# Patient Record
Sex: Female | Born: 1975 | Race: Black or African American | Hispanic: No | Marital: Single | Smoking: Never smoker
Health system: Southern US, Community
[De-identification: ages and names within clinical notes are randomized; demographics above are authoritative.]

## PROBLEM LIST (undated history)

## (undated) ENCOUNTER — Emergency Department: Payer: Self-pay

## (undated) DIAGNOSIS — F32A Depression, unspecified: Secondary | ICD-10-CM

## (undated) DIAGNOSIS — K635 Polyp of colon: Secondary | ICD-10-CM

## (undated) DIAGNOSIS — E669 Obesity, unspecified: Secondary | ICD-10-CM

## (undated) DIAGNOSIS — R7303 Prediabetes: Secondary | ICD-10-CM

## (undated) DIAGNOSIS — K649 Unspecified hemorrhoids: Secondary | ICD-10-CM

## (undated) DIAGNOSIS — N920 Excessive and frequent menstruation with regular cycle: Secondary | ICD-10-CM

## (undated) DIAGNOSIS — D250 Submucous leiomyoma of uterus: Secondary | ICD-10-CM

## (undated) DIAGNOSIS — D649 Anemia, unspecified: Secondary | ICD-10-CM

## (undated) DIAGNOSIS — G4730 Sleep apnea, unspecified: Secondary | ICD-10-CM

## (undated) DIAGNOSIS — E049 Nontoxic goiter, unspecified: Secondary | ICD-10-CM

## (undated) DIAGNOSIS — I499 Cardiac arrhythmia, unspecified: Secondary | ICD-10-CM

## (undated) DIAGNOSIS — G5600 Carpal tunnel syndrome, unspecified upper limb: Secondary | ICD-10-CM

## (undated) DIAGNOSIS — F329 Major depressive disorder, single episode, unspecified: Secondary | ICD-10-CM

## (undated) DIAGNOSIS — N83209 Unspecified ovarian cyst, unspecified side: Secondary | ICD-10-CM

## (undated) HISTORY — PX: COLONOSCOPY: SHX174

## (undated) HISTORY — DX: Major depressive disorder, single episode, unspecified: F32.9

## (undated) HISTORY — DX: Carpal tunnel syndrome, unspecified upper limb: G56.00

## (undated) HISTORY — DX: Polyp of colon: K63.5

## (undated) HISTORY — DX: Depression, unspecified: F32.A

## (undated) HISTORY — DX: Anemia, unspecified: D64.9

---

## 1999-04-17 ENCOUNTER — Other Ambulatory Visit: Admission: RE | Admit: 1999-04-17 | Discharge: 1999-04-17 | Payer: Self-pay | Admitting: *Deleted

## 2000-01-08 ENCOUNTER — Other Ambulatory Visit: Admission: RE | Admit: 2000-01-08 | Discharge: 2000-01-08 | Payer: Self-pay | Admitting: *Deleted

## 2004-01-22 ENCOUNTER — Ambulatory Visit: Payer: Self-pay

## 2004-02-17 ENCOUNTER — Ambulatory Visit: Payer: Self-pay

## 2004-05-04 ENCOUNTER — Ambulatory Visit: Payer: Self-pay

## 2004-09-16 ENCOUNTER — Inpatient Hospital Stay: Payer: Self-pay | Admitting: Unknown Physician Specialty

## 2013-03-08 HISTORY — PX: OTHER SURGICAL HISTORY: SHX169

## 2014-04-08 HISTORY — PX: OTHER SURGICAL HISTORY: SHX169

## 2014-04-24 LAB — HM PAP SMEAR: HM PAP: NORMAL

## 2014-04-24 LAB — HPV, HIGH-RISK: HPV DNA High Risk: NEGATIVE

## 2014-12-04 ENCOUNTER — Encounter (HOSPITAL_COMMUNITY): Payer: Self-pay

## 2014-12-04 ENCOUNTER — Emergency Department (HOSPITAL_COMMUNITY)
Admission: EM | Admit: 2014-12-04 | Discharge: 2014-12-04 | Disposition: A | Discharge status: Home / Self Care | Payer: BLUE CROSS/BLUE SHIELD | Attending: Emergency Medicine | Admitting: Emergency Medicine

## 2014-12-04 DIAGNOSIS — Z3202 Encounter for pregnancy test, result negative: Secondary | ICD-10-CM | POA: Diagnosis not present

## 2014-12-04 DIAGNOSIS — R55 Syncope and collapse: Secondary | ICD-10-CM | POA: Diagnosis not present

## 2014-12-04 LAB — BASIC METABOLIC PANEL
ANION GAP: 7 (ref 5–15)
BUN: 8 mg/dL (ref 6–20)
CHLORIDE: 108 mmol/L (ref 101–111)
CO2: 26 mmol/L (ref 22–32)
Calcium: 9 mg/dL (ref 8.9–10.3)
Creatinine, Ser: 0.69 mg/dL (ref 0.44–1.00)
Glucose, Bld: 100 mg/dL — ABNORMAL HIGH (ref 65–99)
POTASSIUM: 3.5 mmol/L (ref 3.5–5.1)
SODIUM: 141 mmol/L (ref 135–145)

## 2014-12-04 LAB — CBG MONITORING, ED: GLUCOSE-CAPILLARY: 100 mg/dL — AB (ref 65–99)

## 2014-12-04 LAB — URINALYSIS, ROUTINE W REFLEX MICROSCOPIC
BILIRUBIN URINE: NEGATIVE
Glucose, UA: NEGATIVE mg/dL
Hgb urine dipstick: NEGATIVE
KETONES UR: NEGATIVE mg/dL
LEUKOCYTES UA: NEGATIVE
NITRITE: NEGATIVE
Protein, ur: NEGATIVE mg/dL
SPECIFIC GRAVITY, URINE: 1.03 (ref 1.005–1.030)
UROBILINOGEN UA: 0.2 mg/dL (ref 0.0–1.0)
pH: 6 (ref 5.0–8.0)

## 2014-12-04 LAB — CBC
HEMATOCRIT: 35.6 % — AB (ref 36.0–46.0)
Hemoglobin: 11.5 g/dL — ABNORMAL LOW (ref 12.0–15.0)
MCH: 26.7 pg (ref 26.0–34.0)
MCHC: 32.3 g/dL (ref 30.0–36.0)
MCV: 82.8 fL (ref 78.0–100.0)
Platelets: 321 10*3/uL (ref 150–400)
RBC: 4.3 MIL/uL (ref 3.87–5.11)
RDW: 15.1 % (ref 11.5–15.5)
WBC: 9 10*3/uL (ref 4.0–10.5)

## 2014-12-04 LAB — POC URINE PREG, ED: PREG TEST UR: NEGATIVE

## 2014-12-04 NOTE — ED Provider Notes (Signed)
CSN: [id number]     Arrival date & time 12/04/14  1112 History   First MD Initiated Contact with Patient 12/04/14 1112     No chief complaint on file.    (Consider location/radiation/quality/duration/timing/severity/associated sxs/prior Treatment) HPI   39 year old female presenting for evaluation of syncope. Patient was brought here via EMS. Patient report she was sitting at a meeting at her workplace earlier today when she apparently have "zone out". She recall her coworker asking if she has chest pain at that time she felt she may have of a faint chest pain lasting for a few seconds but has since resolved. Coworker was concerned and EMS was called to bring patient to the ED. She had a near syncopal episode without loss of consciousness. She denies falling or injuring herself in any way. At this time she is back to her baseline and has no specific complaint. She admits that she has been taking phentermine for weight loss since August and has lost approximately 11 pounds in one month. She also endorsed having increasing stress both with work and with home for the past month in which she has not been eating or sleeping by she normally does. Her daughter was recently diagnosed with diabetes type 1 which concerns her. She did not eat much yesterday. Her last menstrual period was 9/22 /2016. She also endorsed some cold symptoms for the past 3 days including occasional cough and sore throat which seems to be improving. Otherwise patient denies having fever, chills, headache, neck stiffness, active chest pain, shortness of breath, abdominal pain, nausea vomiting diarrhea, dysuria, focal numbness or weakness, or rash. She denies self medicating with alcohol or street drugs. She denies any homicidal or suicidal ideation. She denies any prior history of cardiac disease, arrhythmia, or exertional syncope.  No past medical history on file. No past surgical history on file. No family history on file. Social  History  Substance Use Topics  . Smoking status: Not on file  . Smokeless tobacco: Not on file  . Alcohol Use: Not on file   OB History    No data available     Review of Systems  All other systems reviewed and are negative.     Allergies  Review of patient's allergies indicates not on file.  Home Medications   Prior to Admission medications   Not on File   There were no vitals taken for this visit. Physical Exam  Constitutional: She is oriented to person, place, and time. She appears well-developed and well-nourished. No distress.  HENT:  Head: Atraumatic.  Mouth/Throat: Oropharynx is clear and moist.  Eyes: Conjunctivae and EOM are normal. Pupils are equal, round, and reactive to light.  Neck: Normal range of motion. Neck supple.  No nuchal rigidity  Cardiovascular: Normal rate and regular rhythm.   Pulmonary/Chest: Effort normal and breath sounds normal.  Abdominal: Soft. Bowel sounds are normal. She exhibits no distension. There is no tenderness.  Neurological: She is alert and oriented to person, place, and time.  Neurologic exam:  Speech clear, pupils equal round reactive to light, extraocular movements intact  Normal peripheral visual fields Cranial nerves III through XII normal including no facial droop Follows commands, moves all extremities x4, normal strength to bilateral upper and lower extremities at all major muscle groups including grip Sensation normal to light touch  Coordination intact, no limb ataxia, finger-nose-finger normal Rapid alternating movements normal No pronator drift Gait normal   Skin: No rash noted.  Psychiatric: She has a  normal mood and affect.  Nursing note and vitals reviewed.   ED Course  Procedures (including critical care time)  Patient here for evaluation of near syncope. Workup initiated. Patient is in no acute discomfort and her vital signs stable.  2:42 PM Patient has normal orthostatic vital sign. No evidence of  concerning anemia. EKG without abnormal arrhythmia or acute ischemic changes, pregnancy test is negative, electrolytes and labs are reassuring, and UA shows no signs of action. She is resting comfortably and felt comfortable to follow-up with her PCP as needed. Return precautions discussed.  Labs Review Labs Reviewed  BASIC METABOLIC PANEL - Abnormal; Notable for the following:    Glucose, Bld 100 (*)    All other components within normal limits  CBC - Abnormal; Notable for the following:    Hemoglobin 11.5 (*)    HCT 35.6 (*)    All other components within normal limits  CBG MONITORING, ED - Abnormal; Notable for the following:    Glucose-Capillary 100 (*)    All other components within normal limits  URINALYSIS, ROUTINE W REFLEX MICROSCOPIC (NOT AT [facility])  POC URINE PREG, ED    Imaging Review No results found. I have personally reviewed and evaluated these images and lab results as part of my medical decision-making.   EKG Interpretation None      Date: 12/04/2014  Rate: 80  Rhythm: normal sinus rhythm  QRS Axis: normal  Intervals: normal  ST/T Wave abnormalities: normal  Conduction Disutrbances: none  Narrative Interpretation:   Old EKG Reviewed: No significant changes noted     MDM   Final diagnoses:  Near syncope    BP 125/61 mmHg  Pulse 78  Temp(Src) 98.2 F (36.8 C) (Oral)  Resp 20  Ht 5\' 2"  (1.575 m)  Wt 207 lb 8 oz (94.121 kg)  BMI 37.94 kg/m2  SpO2 100%  LMP 11/28/2014     Domenic Moras, PA-C 12/04/14 [place], MD 12/04/14 [phone]

## 2014-12-04 NOTE — ED Notes (Signed)
Pt was at work in a meeting when she had a near syncopal episode.  No LOC, n/v/d or slurred speech.  Coworkers report that pt c/o mild chest pain during the episode by pt does not recall this and is pain free at this time.  Pt was placed on Phentermine in August and reports a decrease in fluid intake since taking.  Pt is A&Ox4 and in NAD.

## 2014-12-04 NOTE — Discharge Instructions (Signed)
Near-Syncope Near-syncope (commonly known as near fainting) is sudden weakness, dizziness, or feeling like you might pass out. During an episode of near-syncope, you may also develop pale skin, have tunnel vision, or feel sick to your stomach (nauseous). Near-syncope may occur when getting up after sitting or while standing for a long time. It is caused by a sudden decrease in blood flow to the brain. This decrease can result from various causes or triggers, most of which are not serious. However, because near-syncope can sometimes be a sign of something serious, a medical evaluation is required. The specific cause is often not determined. HOME CARE INSTRUCTIONS  Monitor your condition for any changes. The following actions may help to alleviate any discomfort you are experiencing:  Have someone stay with you until you feel stable.  Lie down right away and prop your feet up if you start feeling like you might faint. Breathe deeply and steadily. Wait until all the symptoms have passed. Most of these episodes last only a few minutes. You may feel tired for several hours.   Drink enough fluids to keep your urine clear or pale yellow.   If you are taking blood pressure or heart medicine, get up slowly when seated or lying down. Take several minutes to sit and then stand. This can reduce dizziness.  Follow up with your health care provider as directed. SEEK IMMEDIATE MEDICAL CARE IF:   You have a severe headache.   You have unusual pain in the chest, abdomen, or back.   You are bleeding from the mouth or rectum, or you have black or tarry stool.   You have an irregular or very fast heartbeat.   You have repeated fainting or have seizure-like jerking during an episode.   You faint when sitting or lying down.   You have confusion.   You have difficulty walking.   You have severe weakness.   You have vision problems.  MAKE SURE YOU:   Understand these instructions.  Will  watch your condition.  Will get help right away if you are not doing well or get worse. Document Released: 02/22/2005 Document Revised: 02/27/2013 Document Reviewed: 07/28/2012 Exit[employer] Patient Information 2015 [employer]. This information is not intended to replace advice given to you by your health care provider. Make sure you discuss any questions you have with your health care provider.  

## 2015-02-19 ENCOUNTER — Encounter: Payer: Self-pay | Admitting: Family Medicine

## 2015-02-19 ENCOUNTER — Telehealth: Payer: Self-pay | Admitting: Family Medicine

## 2015-02-19 ENCOUNTER — Ambulatory Visit (INDEPENDENT_AMBULATORY_CARE_PROVIDER_SITE_OTHER): Payer: BLUE CROSS/BLUE SHIELD | Admitting: Family Medicine

## 2015-02-19 VITALS — BP 134/78 | HR 75 | Temp 98.2°F | Resp 16 | Ht 61.5 in | Wt 217.0 lb

## 2015-02-19 DIAGNOSIS — R030 Elevated blood-pressure reading, without diagnosis of hypertension: Secondary | ICD-10-CM

## 2015-02-19 DIAGNOSIS — IMO0001 Reserved for inherently not codable concepts without codable children: Secondary | ICD-10-CM

## 2015-02-19 DIAGNOSIS — R079 Chest pain, unspecified: Secondary | ICD-10-CM

## 2015-02-19 DIAGNOSIS — F419 Anxiety disorder, unspecified: Secondary | ICD-10-CM

## 2015-02-19 DIAGNOSIS — K625 Hemorrhage of anus and rectum: Secondary | ICD-10-CM

## 2015-02-19 DIAGNOSIS — M545 Low back pain: Secondary | ICD-10-CM

## 2015-02-19 DIAGNOSIS — E669 Obesity, unspecified: Secondary | ICD-10-CM

## 2015-02-19 DIAGNOSIS — R42 Dizziness and giddiness: Secondary | ICD-10-CM | POA: Diagnosis not present

## 2015-02-19 DIAGNOSIS — G5600 Carpal tunnel syndrome, unspecified upper limb: Secondary | ICD-10-CM | POA: Insufficient documentation

## 2015-02-19 LAB — POCT URINALYSIS DIPSTICK
Bilirubin, UA: NEGATIVE
Glucose, UA: NEGATIVE
Leukocytes, UA: NEGATIVE
NITRITE UA: NEGATIVE
PH UA: 6
PROTEIN UA: NEGATIVE
RBC UA: NEGATIVE
SPEC GRAV UA: 1.01
UROBILINOGEN UA: 0.2

## 2015-02-19 MED ORDER — SERTRALINE HCL 25 MG PO TABS: ORAL_TABLET | ORAL | Status: DC

## 2015-02-19 NOTE — Telephone Encounter (Signed)
OK to re-establish and make appointment to check blood pressure.

## 2015-02-19 NOTE — Progress Notes (Signed)
Patient: Savannah Kelly Female    DOB: 1975-09-19   39 y.o.   MRN: [id number] Visit Date: 02/19/2015  Today's Provider: Lelon Huh, MD   Chief Complaint  Patient presents with  . Re establishcare  . Blood Pressure Check   Subjective:    HPI  Blood pressure Check:  Patient comes in today stating yesterday at work her she became dizzy and lightheaded. She states that her blood pressure was checked at work and was176/107. Since then she has experienced brief episodes of blurry vison, headache, pain in the chest wall muscle fatigue and back pain.  She states she has had a lot of stress and anxiety for the last several months.   She also reports  Having several episodes of finding blood on tissue after having bowel movements. She has had colonoscopy several years by Dr. Nicolasa Ducking which she states found hemorrhoids.   She has also been having episodes of numbness in her thumb and index finger off and on.      No Known Allergies Previous Medications   PHENTERMINE (ADIPEX-P) 37.5 MG TABLET    Take 37.5 mg by mouth daily.    Review of Systems  Constitutional: Negative for fever, chills and fatigue.  HENT: Negative for congestion, ear pain, rhinorrhea, sneezing and sore throat.   Eyes: Positive for visual disturbance (blurry vision). Negative for pain and redness.  Respiratory: Negative for cough, shortness of breath and wheezing.   Cardiovascular: Negative for chest pain and leg swelling.  Gastrointestinal: Positive for anal bleeding. Negative for nausea, abdominal pain, diarrhea, constipation and blood in stool.  Endocrine: Negative for polydipsia and polyphagia.  Genitourinary: Negative.  Negative for dysuria, hematuria, flank pain, vaginal bleeding, vaginal discharge and pelvic pain.  Musculoskeletal: Positive for back pain. Negative for joint swelling, arthralgias and gait problem.  Skin: Negative for rash.  Neurological: Positive for dizziness, light-headedness and  headaches. Negative for tremors, seizures, weakness and numbness.  Hematological: Negative for adenopathy.  Psychiatric/Behavioral: Negative.  Negative for behavioral problems, confusion and dysphoric mood. The patient is not nervous/anxious and is not hyperactive.     Social History  Substance Use Topics  . Smoking status: Never Smoker   . Smokeless tobacco: Not on file  . Alcohol Use: No   Objective:   BP 134/78 mmHg  Pulse 75  Temp(Src) 98.2 F (36.8 C) (Oral)  Resp 16  Ht 5' 1.5" (1.562 m)  Wt 217 lb (98.431 kg)  BMI 40.34 kg/m2  SpO2 100%  Physical Exam  General Appearance:    Alert, cooperative, no distress, obese  Eyes:    PERRL, conjunctiva/corneas clear, EOM's intact       Lungs:     Clear to auscultation bilaterally, respirations unlabored  Heart:    Regular rate and rhythm  Neurologic:   Awake, alert, oriented x 3. No apparent focal neurological           defect.          Assessment & Plan:     1. Dizziness Likely multifactorial. Check Labs. May be entirely due to anxiety as below.  - CBC - Comprehensive metabolic panel - T4 AND TSH  2. Chest pain, unspecified chest pain type Normal EKG.  - EKG 12-Lead  3. Acute anxiety Consider trial of SSRI  4. Elevated blood pressure Normal in office today. Labile BP most likely due to anxiety.  5. Blood per rectum Has history of hemorrhoids on colonoscopy.   6.  Low back pain  - POCT urinalysis dipstick      Lelon Huh, MD  [facility]

## 2015-02-19 NOTE — Telephone Encounter (Signed)
Pease advise.

## 2015-02-19 NOTE — Telephone Encounter (Signed)
Pt stated that she feels like her BP may be elevated and she would like to be re-established asap. Pt hasn't been seen in our office since 06/03/2008. Pt stated that she has been going to her OBGYN for her physical and hasn't been anywhere else. Can we re-establish pt and if so can she be worked in today or tomorrow? Please advise. Thanks TNP

## 2015-02-19 NOTE — Telephone Encounter (Signed)
Spoke with pt and scheduled appt for this afternoon. Thanks TNP

## 2015-02-20 LAB — CBC
Hematocrit: 34 % (ref 34.0–46.6)
Hemoglobin: 10.8 g/dL — ABNORMAL LOW (ref 11.1–15.9)
MCH: 26.2 pg — AB (ref 26.6–33.0)
MCHC: 31.8 g/dL (ref 31.5–35.7)
MCV: 83 fL (ref 79–97)
PLATELETS: 308 10*3/uL (ref 150–379)
RBC: 4.12 x10E6/uL (ref 3.77–5.28)
RDW: 14.6 % (ref 12.3–15.4)
WBC: 8.2 10*3/uL (ref 3.4–10.8)

## 2015-02-20 LAB — COMPREHENSIVE METABOLIC PANEL
ALT: 12 IU/L (ref 0–32)
AST: 10 IU/L (ref 0–40)
Albumin/Globulin Ratio: 1.8 (ref 1.1–2.5)
Albumin: 4.2 g/dL (ref 3.5–5.5)
Alkaline Phosphatase: 83 IU/L (ref 39–117)
BUN/Creatinine Ratio: 14 (ref 8–20)
BUN: 9 mg/dL (ref 6–20)
CALCIUM: 9.5 mg/dL (ref 8.7–10.2)
CHLORIDE: 102 mmol/L (ref 96–106)
CO2: 27 mmol/L (ref 18–29)
Creatinine, Ser: 0.64 mg/dL (ref 0.57–1.00)
GFR calc non Af Amer: 113 mL/min/{1.73_m2} (ref >59)
GFR, EST AFRICAN AMERICAN: 130 mL/min/{1.73_m2} (ref >59)
GLUCOSE: 103 mg/dL — AB (ref 65–99)
Globulin, Total: 2.3 g/dL (ref 1.5–4.5)
Potassium: 3.5 mmol/L (ref 3.5–5.2)
Sodium: 141 mmol/L (ref 134–144)
TOTAL PROTEIN: 6.5 g/dL (ref 6.0–8.5)

## 2015-02-20 LAB — T4 AND TSH
T4, Total: 8 ug/dL (ref 4.5–12.0)
TSH: 0.457 u[IU]/mL (ref 0.450–4.500)

## 2015-02-21 ENCOUNTER — Other Ambulatory Visit: Payer: Self-pay | Admitting: *Deleted

## 2015-02-21 MED ORDER — METOPROLOL SUCCINATE ER 25 MG PO TB24: 25.0000 mg | ORAL_TABLET | Freq: Every day | ORAL | Status: DC

## 2015-02-21 NOTE — Telephone Encounter (Signed)
Patient requested an out of work letter due to dizziness. Patient stated that she is going to try to go back to work Monday 02/24/2015. Patient also wanted to let Dr. Caryn Section know that she has been dizzy and feels like her heart is racing all day today.

## 2015-02-21 NOTE — Telephone Encounter (Signed)
-----   Message from Birdie Sons, MD sent at 02/21/2015  8:16 AM EST ----- Labs are all normal. Dizziness is probably related to stress. Continue sertraline that was prescribed. Also recommend starting metoprolol ER 25mg  once a day, #30,  which helps keep blood pressure down and helps with anxiety. Follow up o.v in 3 weeks.

## 2015-02-21 NOTE — Telephone Encounter (Signed)
Patient was notified of results. Patient expressed understanding. Rx was sent to pharmacy.  

## 2015-03-03 DIAGNOSIS — F419 Anxiety disorder, unspecified: Secondary | ICD-10-CM | POA: Insufficient documentation

## 2015-03-03 DIAGNOSIS — K625 Hemorrhage of anus and rectum: Secondary | ICD-10-CM | POA: Insufficient documentation

## 2015-03-03 DIAGNOSIS — R079 Chest pain, unspecified: Secondary | ICD-10-CM | POA: Insufficient documentation

## 2015-03-03 DIAGNOSIS — IMO0001 Reserved for inherently not codable concepts without codable children: Secondary | ICD-10-CM | POA: Insufficient documentation

## 2015-03-03 DIAGNOSIS — R030 Elevated blood-pressure reading, without diagnosis of hypertension: Secondary | ICD-10-CM

## 2015-03-21 ENCOUNTER — Other Ambulatory Visit: Payer: Self-pay | Admitting: Family Medicine

## 2015-10-21 ENCOUNTER — Ambulatory Visit (INDEPENDENT_AMBULATORY_CARE_PROVIDER_SITE_OTHER): Payer: Managed Care, Other (non HMO) | Admitting: Family Medicine

## 2015-10-21 ENCOUNTER — Encounter: Payer: Self-pay | Admitting: Family Medicine

## 2015-10-21 VITALS — BP 148/80 | HR 64 | Temp 98.4°F | Resp 18 | Wt 229.0 lb

## 2015-10-21 DIAGNOSIS — J069 Acute upper respiratory infection, unspecified: Secondary | ICD-10-CM

## 2015-10-21 MED ORDER — FLUTICASONE PROPIONATE 50 MCG/ACT NA SUSP: 2.0000 | Freq: Every day | NASAL | 6 refills | Status: DC

## 2015-10-21 NOTE — Progress Notes (Signed)
       Patient: Savannah Kelly Female    DOB: 03-Mar-1976   40 y.o.   MRN: [id number] Visit Date: 10/21/2015  Today's Provider: Lelon Huh, MD   Chief Complaint  Patient presents with  . URI   Subjective:    URI   This is a new problem. Episode onset: 3 days ago. The problem has been gradually worsening. There has been no fever. Associated symptoms include congestion, coughing (productive with clear phlegm), ear pain, headaches, a plugged ear sensation, sinus pain, sneezing, a sore throat and swollen glands. Pertinent negatives include no abdominal pain, chest pain, joint pain, joint swelling, nausea, rhinorrhea, vomiting or wheezing. Treatments tried: Alka Seltzer Plus  The treatment provided no relief.  Patient states this morning she woke up with swelling on the left side of her face.      No Known Allergies Current Meds  Medication Sig  . Omega-3 Fatty Acids (FISH OIL) 1000 MG CAPS Take 3 capsules by mouth daily.    Review of Systems  Constitutional: Positive for fatigue. Negative for appetite change, chills and fever.  HENT: Positive for congestion, ear pain, sneezing and sore throat. Negative for rhinorrhea.   Respiratory: Positive for cough (productive with clear phlegm). Negative for chest tightness, shortness of breath and wheezing.   Cardiovascular: Negative for chest pain and palpitations.  Gastrointestinal: Negative for abdominal pain, nausea and vomiting.  Musculoskeletal: Negative for joint pain.  Neurological: Positive for light-headedness and headaches. Negative for dizziness and weakness.    Social History  Substance Use Topics  . Smoking status: Never Smoker  . Smokeless tobacco: Never Used  . Alcohol use No   Objective:   BP (!) 148/80 (BP Location: Left Arm, Patient Position: Sitting, Cuff Size: Large)   Pulse 64   Temp 98.4 F (36.9 C) (Oral)   Resp 18   Wt 229 lb (103.9 kg)   SpO2 97% Comment: room air  BMI 42.57 kg/m   Physical  Exam  General Appearance:    Alert, cooperative, no distress  HENT:   bilateral TM normal without fluid or infection, neck without nodes, pharynx erythematous without exudate, post nasal drip noted and nasal mucosa pale and congested  Eyes:    PERRL, conjunctiva/corneas clear, EOM's intact       Lungs:     Clear to auscultation bilaterally, respirations unlabored  Heart:    Regular rate and rhythm  Neurologic:   Awake, alert, oriented x 3. No apparent focal neurological           defect.           Assessment & Plan:     1. Upper respiratory infection  - fluticasone (FLONASE) 50 MCG/ACT nasal spray; Place 2 sprays into both nostrils daily.  Dispense: 16 g; Refill: 6  Call if symptoms change or if not rapidly improving.   Work excuse for today and tomorrow.       Lelon Huh, MD  [facility]

## 2015-10-24 ENCOUNTER — Telehealth: Payer: Self-pay | Admitting: Family Medicine

## 2015-10-24 MED ORDER — AMOXICILLIN-POT CLAVULANATE 875-125 MG PO TABS
1.0000 | ORAL_TABLET | Freq: Two times a day (BID) | ORAL | 0 refills | Status: AC
Start: 2015-10-24 — End: 2015-11-03

## 2015-10-24 NOTE — Telephone Encounter (Signed)
Pt called saying she was in on Tuesday and was told to talk back if her symptoms of congestion got worse.  She states that it has gotten thicker and greenish in color.  She thinks she may needs an antibiotic.  She says if you prescribe an antiobiotic please send diflucan with it.  She uses [facility] in [place]  Her call back is [phone]  Thanks Con Memos

## 2015-10-24 NOTE — Telephone Encounter (Signed)
rx sent to [facility]

## 2015-10-24 NOTE — Telephone Encounter (Signed)
Please advise 

## 2015-10-24 NOTE — Telephone Encounter (Signed)
Patient was notified.

## 2016-03-08 HISTORY — PX: BILATERAL CARPAL TUNNEL RELEASE: SHX6508

## 2016-05-28 ENCOUNTER — Encounter: Payer: Self-pay | Admitting: Emergency Medicine

## 2016-05-28 ENCOUNTER — Ambulatory Visit
Admission: EM | Admit: 2016-05-28 | Discharge: 2016-05-28 | Disposition: A | Discharge status: Home / Self Care | Payer: 59 | Attending: Family Medicine | Admitting: Family Medicine

## 2016-05-28 DIAGNOSIS — N390 Urinary tract infection, site not specified: Secondary | ICD-10-CM | POA: Diagnosis not present

## 2016-05-28 DIAGNOSIS — B3731 Acute candidiasis of vulva and vagina: Secondary | ICD-10-CM

## 2016-05-28 DIAGNOSIS — R300 Dysuria: Secondary | ICD-10-CM

## 2016-05-28 DIAGNOSIS — G5603 Carpal tunnel syndrome, bilateral upper limbs: Secondary | ICD-10-CM | POA: Diagnosis not present

## 2016-05-28 DIAGNOSIS — B373 Candidiasis of vulva and vagina: Secondary | ICD-10-CM

## 2016-05-28 LAB — URINALYSIS, COMPLETE (UACMP) WITH MICROSCOPIC
BACTERIA UA: NONE SEEN
Bilirubin Urine: NEGATIVE
Glucose, UA: NEGATIVE mg/dL
Hgb urine dipstick: NEGATIVE
Ketones, ur: NEGATIVE mg/dL
NITRITE: NEGATIVE
PH: 7 (ref 5.0–8.0)
Protein, ur: NEGATIVE mg/dL
SPECIFIC GRAVITY, URINE: 1.02 (ref 1.005–1.030)

## 2016-05-28 MED ORDER — CEPHALEXIN 500 MG PO CAPS
500.0000 mg | ORAL_CAPSULE | Freq: Two times a day (BID) | ORAL | 0 refills | Status: DC
Start: 2016-05-28 — End: 2017-05-06

## 2016-05-28 MED ORDER — PHENAZOPYRIDINE HCL 200 MG PO TABS: 200.0000 mg | ORAL_TABLET | Freq: Three times a day (TID) | ORAL | 0 refills | Status: DC | PRN

## 2016-05-28 MED ORDER — FLUCONAZOLE 150 MG PO TABS: 150.0000 mg | ORAL_TABLET | Freq: Once | ORAL | 0 refills | Status: AC

## 2016-05-28 NOTE — ED Triage Notes (Signed)
Patient states she feels like she has a Yeast infection and UTI

## 2016-05-28 NOTE — ED Provider Notes (Signed)
[facility]    CSN: [id number] Arrival date & time: 05/28/16  1054     History   Chief Complaint Chief Complaint  Patient presents with  . Urinary Tract Infection    HPI Savannah Kelly is a 41 y.o. female.   Patient's here because of symptoms of UTI and yeast infection. She reports having some burning urination frequency best been going on for several days. She also reports having a whitish discharge which she thinks is yeast she states she's had yeast infections before. States this will yeast infections looked like before but this one has not responded well despite using a little bit of the Monistat. She did not have a lot of Monistat switched use the full amount but things to get better for a while but then got worse. One question about BV she states she's had BV before and this does not be discharged test she's sac consistent with BV because is odorless and not nearly as bad. His had a C-section for the past she's had history some elevated blood pressure and she's had history of anemia and colonic polyps in the past. She does not smoke never smoked. Father Polyps and brothers had hypertension and daughter with diabetes type 1. His been a while since she has had a UTI   The history is provided by the patient. No language interpreter was used.  Urinary Tract Infection  Pain quality:  Aching and burning Pain severity:  Moderate Onset quality:  Sudden Timing:  Constant Progression:  Worsening Chronicity:  New Recent urinary tract infections: no   Relieved by:  Nothing Ineffective treatments:  None tried Urinary symptoms: frequent urination   Urinary symptoms: no discolored urine and no foul-smelling urine   Associated symptoms: vaginal discharge   Risk factors: no kidney transplant, no recurrent urinary tract infections and no sexually transmitted infections     Past Medical History:  Diagnosis Date  . Anemia   . Carpal tunnel syndrome   . Colon polyps   .  Depression     Patient Active Problem List   Diagnosis Date Noted  . Pain in the chest 03/03/2015  . Acute anxiety 03/03/2015  . Elevated blood pressure 03/03/2015  . Blood per rectum 03/03/2015  . Carpal tunnel syndrome 02/19/2015  . Obesity 02/19/2015  . Dizziness 02/19/2015    Past Surgical History:  Procedure Laterality Date  . CESAREAN SECTION  2006  . mammogram screening  2015  . pap smear  04/2014    OB History    Gravida Para Term Preterm AB Living   3 1           SAB TAB Ectopic Multiple Live Births                   Home Medications    Prior to Admission medications   Medication Sig Start Date End Date Taking? Authorizing Provider  cephALEXin (KEFLEX) 500 MG capsule Take 1 capsule (500 mg total) by mouth 2 (two) times daily. 05/28/16   Frederich Cha, MD  fluconazole (DIFLUCAN) 150 MG tablet Take 1 tablet (150 mg total) by mouth once. 05/28/16 05/28/16  Frederich Cha, MD  fluticasone (FLONASE) 50 MCG/ACT nasal spray Place 2 sprays into both nostrils daily. 10/21/15   Birdie Sons, MD  metoprolol succinate (TOPROL-XL) 25 MG 24 hr tablet TAKE 1 TABLET (25 MG TOTAL) BY MOUTH DAILY. Patient not taking: Reported on 10/21/2015 03/21/15   Birdie Sons, MD  Omega-3 Fatty  Acids (FISH OIL) 1000 MG CAPS Take 3 capsules by mouth daily.    Historical Provider, MD  phenazopyridine (PYRIDIUM) 200 MG tablet Take 1 tablet (200 mg total) by mouth 3 (three) times daily as needed for pain. 05/28/16   Frederich Cha, MD  phentermine (ADIPEX-P) 37.5 MG tablet Take 37.5 mg by mouth daily. 11/12/14   Historical Provider, MD  sertraline (ZOLOFT) 25 MG tablet 1/2 tablet daily for six days, then 1 daily for 6 days, then 2 daily Patient not taking: Reported on 10/21/2015 02/19/15   Birdie Sons, MD    Family History Family History  Problem Relation Age of Onset  . Colon polyps Father   . Hypertension Brother   . Diabetes Daughter 10    type 1  . Diabetes Maternal Aunt   . Stroke Paternal  Grandfather   . Alzheimer's disease Paternal Grandfather     Social History Social History  Substance Use Topics  . Smoking status: Never Smoker  . Smokeless tobacco: Never Used  . Alcohol use Not on file     Allergies   Patient has no known allergies.   Review of Systems Review of Systems  Genitourinary: Positive for vaginal discharge.     Physical Exam Triage Vital Signs ED Triage Vitals  Enc Vitals Group     BP 05/28/16 1112 137/71     Pulse Rate 05/28/16 1112 70     Resp 05/28/16 1112 18     Temp 05/28/16 1112 98.3 F (36.8 C)     Temp Source 05/28/16 1112 Oral     SpO2 05/28/16 1112 99 %     Weight 05/28/16 1116 240 lb (108.9 kg)     Height 05/28/16 1116 5\' 1"  (1.549 m)     Head Circumference --      Peak Flow --      Pain Score 05/28/16 1110 6     Pain Loc --      Pain Edu? --      Excl. in [place]? --    No data found.   Updated Vital Signs BP 137/71 (BP Location: Left Arm)   Pulse 70   Temp 98.3 F (36.8 C) (Oral)   Resp 18   Ht 5\' 1"  (1.549 m)   Wt 240 lb (108.9 kg)   LMP 04/04/2016 (Approximate)   SpO2 99%   BMI 45.35 kg/m   Visual Acuity Right Eye Distance:   Left Eye Distance:   Bilateral Distance:    Right Eye Near:   Left Eye Near:    Bilateral Near:     Physical Exam  Constitutional: She is oriented to person, place, and time. She appears well-developed and well-nourished.  HENT:  Head: Normocephalic and atraumatic.  Right Ear: External ear normal.  Left Ear: External ear normal.  Mouth/Throat: Oropharynx is clear and moist.  Eyes: EOM are normal. Pupils are equal, round, and reactive to light.  Neck: Normal range of motion.  Pulmonary/Chest: Effort normal.  Abdominal: Soft. Bowel sounds are normal. She exhibits no distension. There is no tenderness.  Musculoskeletal: Normal range of motion. She exhibits no edema.  Neurological: She is alert and oriented to person, place, and time.  Skin: Skin is warm.  Psychiatric: She has a  normal mood and affect.  Vitals reviewed.    UC Treatments / Results  Labs (all labs ordered are listed, but only abnormal results are displayed) Labs Reviewed  URINALYSIS, COMPLETE (UACMP) WITH MICROSCOPIC - Abnormal; Notable for the  following:       Result Value   Leukocytes, UA SMALL (*)    Squamous Epithelial / LPF 0-5 (*)    All other components within normal limits  URINE CULTURE    EKG  EKG Interpretation None       Radiology No results found.  Procedures Procedures (including critical care time)  Medications Ordered in UC Medications - No data to display  Results for orders placed or performed during the hospital encounter of 05/28/16  Urinalysis, Complete w Microscopic  Result Value Ref Range   Color, Urine YELLOW YELLOW   APPearance CLEAR CLEAR   Specific Gravity, Urine 1.020 1.005 - 1.030   pH 7.0 5.0 - 8.0   Glucose, UA NEGATIVE NEGATIVE mg/dL   Hgb urine dipstick NEGATIVE NEGATIVE   Bilirubin Urine NEGATIVE NEGATIVE   Ketones, ur NEGATIVE NEGATIVE mg/dL   Protein, ur NEGATIVE NEGATIVE mg/dL   Nitrite NEGATIVE NEGATIVE   Leukocytes, UA SMALL (A) NEGATIVE   Squamous Epithelial / LPF 0-5 (A) NONE SEEN   WBC, UA 6-30 0 - 5 WBC/hpf   RBC / HPF 0-5 0 - 5 RBC/hpf   Bacteria, UA NONE SEEN NONE SEEN   Mucous PRESENT    Initial Impression / Assessment and Plan / UC Course  I have reviewed the triage vital signs and the nursing notes.  Pertinent labs & imaging results that were available during my care of the patient were reviewed by me and considered in my medical decision making (see chart for details).   her urine was that that positive but that was 60-30 to be sees and she did have small mild leukocytes. With the presence of possible yeast often do pelvic exam she is declined to have it done right now SEEK treatment for the UTI. We will place on Keflex 500 for 7 days Pyridium for 5 days with change in her urine orange and also give her a prescription  for Diflucan 2 tablets take 1 now and repeat in a week. If she is not better within the next 5-7 days with EDD come back here or see her PCP for pelvic exam and reevaluation urine culture was also obtained.   Final Clinical Impressions(s) / UC Diagnoses   Final diagnoses:  Dysuria  Urinary tract infection without hematuria, site unspecified  Yeast vaginitis    New Prescriptions New Prescriptions   CEPHALEXIN (KEFLEX) 500 MG CAPSULE    Take 1 capsule (500 mg total) by mouth 2 (two) times daily.   FLUCONAZOLE (DIFLUCAN) 150 MG TABLET    Take 1 tablet (150 mg total) by mouth once.   PHENAZOPYRIDINE (PYRIDIUM) 200 MG TABLET    Take 1 tablet (200 mg total) by mouth 3 (three) times daily as needed for pain.    Note: This dictation was prepared with Dragon dictation along with smaller phrase technology. Any transcriptional errors that result from this process are unintentional.   Frederich Cha, MD 05/28/16 1200

## 2016-05-29 LAB — URINE CULTURE
Culture: NO GROWTH
Special Requests: NORMAL

## 2016-05-31 DIAGNOSIS — M722 Plantar fascial fibromatosis: Secondary | ICD-10-CM | POA: Diagnosis not present

## 2016-06-07 ENCOUNTER — Telehealth: Payer: Self-pay

## 2016-06-07 NOTE — Telephone Encounter (Signed)
Spoke to patient and informed her that she would need to come in for a pelvic exam to confirm that it is BV before we can treat it. She was also informed that this would be a new visit.  I talked with Dr Alveta Heimlich about this patient visit.

## 2016-06-08 DIAGNOSIS — R102 Pelvic and perineal pain: Secondary | ICD-10-CM | POA: Diagnosis not present

## 2016-06-21 DIAGNOSIS — M722 Plantar fascial fibromatosis: Secondary | ICD-10-CM | POA: Diagnosis not present

## 2016-06-28 DIAGNOSIS — G5603 Carpal tunnel syndrome, bilateral upper limbs: Secondary | ICD-10-CM | POA: Diagnosis not present

## 2016-06-29 ENCOUNTER — Other Ambulatory Visit: Payer: Self-pay | Admitting: Obstetrics and Gynecology

## 2016-06-29 DIAGNOSIS — Z1211 Encounter for screening for malignant neoplasm of colon: Secondary | ICD-10-CM | POA: Diagnosis not present

## 2016-06-29 DIAGNOSIS — N760 Acute vaginitis: Secondary | ICD-10-CM | POA: Diagnosis not present

## 2016-06-29 DIAGNOSIS — Z1231 Encounter for screening mammogram for malignant neoplasm of breast: Secondary | ICD-10-CM

## 2016-06-29 DIAGNOSIS — R1319 Other dysphagia: Secondary | ICD-10-CM | POA: Diagnosis not present

## 2016-06-29 DIAGNOSIS — Z01419 Encounter for gynecological examination (general) (routine) without abnormal findings: Secondary | ICD-10-CM | POA: Diagnosis not present

## 2016-07-02 ENCOUNTER — Emergency Department
Admission: EM | Admit: 2016-07-02 | Discharge: 2016-07-02 | Disposition: A | Discharge status: Home / Self Care | Payer: 59 | Attending: Emergency Medicine | Admitting: Emergency Medicine

## 2016-07-02 ENCOUNTER — Encounter: Payer: Self-pay | Admitting: Emergency Medicine

## 2016-07-02 DIAGNOSIS — I10 Essential (primary) hypertension: Secondary | ICD-10-CM | POA: Insufficient documentation

## 2016-07-02 DIAGNOSIS — R42 Dizziness and giddiness: Secondary | ICD-10-CM

## 2016-07-02 DIAGNOSIS — Z79899 Other long term (current) drug therapy: Secondary | ICD-10-CM | POA: Diagnosis not present

## 2016-07-02 HISTORY — DX: Obesity, unspecified: E66.9

## 2016-07-02 LAB — CBC
HCT: 36.4 % (ref 35.0–47.0)
HEMOGLOBIN: 11.8 g/dL — AB (ref 12.0–16.0)
MCH: 26.3 pg (ref 26.0–34.0)
MCHC: 32.5 g/dL (ref 32.0–36.0)
MCV: 80.9 fL (ref 80.0–100.0)
Platelets: 356 10*3/uL (ref 150–440)
RBC: 4.5 MIL/uL (ref 3.80–5.20)
RDW: 15.8 % — AB (ref 11.5–14.5)
WBC: 9.8 10*3/uL (ref 3.6–11.0)

## 2016-07-02 LAB — URINALYSIS, COMPLETE (UACMP) WITH MICROSCOPIC
BILIRUBIN URINE: NEGATIVE
Glucose, UA: NEGATIVE mg/dL
Hgb urine dipstick: NEGATIVE
KETONES UR: NEGATIVE mg/dL
LEUKOCYTES UA: NEGATIVE
NITRITE: NEGATIVE
PROTEIN: NEGATIVE mg/dL
Specific Gravity, Urine: 1.004 — ABNORMAL LOW (ref 1.005–1.030)
pH: 6 (ref 5.0–8.0)

## 2016-07-02 LAB — BASIC METABOLIC PANEL
ANION GAP: 5 (ref 5–15)
BUN: 9 mg/dL (ref 6–20)
CALCIUM: 9 mg/dL (ref 8.9–10.3)
CO2: 28 mmol/L (ref 22–32)
Chloride: 106 mmol/L (ref 101–111)
Creatinine, Ser: 0.67 mg/dL (ref 0.44–1.00)
GFR calc Af Amer: >60 mL/min (ref >60)
GLUCOSE: 97 mg/dL (ref 65–99)
POTASSIUM: 3.2 mmol/L — AB (ref 3.5–5.1)
SODIUM: 139 mmol/L (ref 135–145)

## 2016-07-02 LAB — POCT PREGNANCY, URINE: PREG TEST UR: NEGATIVE

## 2016-07-02 NOTE — Discharge Instructions (Signed)

## 2016-07-02 NOTE — ED Triage Notes (Signed)
Pt ambulatory to triage with steady gait, no distress noted at this time. Pt sts she feels her BP is elevated because she checked it at home and it was 160/93 and she has had a headache, blurred vision and feeling of dizziness x1 day. Pt denies hypertension or cardiac HX.

## 2016-07-02 NOTE — ED Provider Notes (Signed)
[facility] Emergency Department Provider Note  ____________________________________________   First MD Initiated Contact with Patient 07/02/16 0320     (approximate)  I have reviewed the triage vital signs and the nursing notes.   HISTORY  Chief Complaint Hypertension    HPI Savannah Kelly is a 41 y.o. female whopresents for evaluation of a very mild headache, some lightheadedness, and elevated blood pressure.  She reports that she has been under a lot of stress recently and has not been eating or drinking as well as usual.  Tonight she felt the symptoms as listed above, all of which were mild, and she checked her blood pressure and it was elevated systolic in the 169I.  She was concerned so they called the paramedics and she reports that EMS found that her blood pressure was over 503 systolic and so  She came to the emergency department for further evaluation.  She reports that she feels much better at this time and her symptoms have resolved.  She was told in the past by her PCP that she had hypertension but she thought it was more "situational" and she has not been taking the metoprolol prescribed for her.  She denies recent illnesses.  She denies fever/chills, chest pain, shortness of breath, nausea, vomiting, abdominal pain, numbness nor weakness in her extremities, dysuria.  Nothing in particular made her symptoms worsened and got better on her own.  The onset was earlier today, within the last 12 hours.  Past Medical History:  Diagnosis Date  . Anemia   . Carpal tunnel syndrome   . Colon polyps   . Depression   . Obesity     Patient Active Problem List   Diagnosis Date Noted  . Pain in the chest 03/03/2015  . Acute anxiety 03/03/2015  . Elevated blood pressure 03/03/2015  . Blood per rectum 03/03/2015  . Carpal tunnel syndrome 02/19/2015  . Obesity 02/19/2015  . Dizziness 02/19/2015    Past Surgical History:  Procedure Laterality Date  .  CESAREAN SECTION  2006  . mammogram screening  2015  . pap smear  04/2014    Prior to Admission medications   Medication Sig Start Date End Date Taking? Authorizing Provider  cephALEXin (KEFLEX) 500 MG capsule Take 1 capsule (500 mg total) by mouth 2 (two) times daily. 05/28/16   Frederich Cha, MD  fluticasone (FLONASE) 50 MCG/ACT nasal spray Place 2 sprays into both nostrils daily. 10/21/15   Birdie Sons, MD  metoprolol succinate (TOPROL-XL) 25 MG 24 hr tablet TAKE 1 TABLET (25 MG TOTAL) BY MOUTH DAILY. Patient not taking: Reported on 10/21/2015 03/21/15   Birdie Sons, MD  Omega-3 Fatty Acids (FISH OIL) 1000 MG CAPS Take 3 capsules by mouth daily.    Historical Provider, MD  phenazopyridine (PYRIDIUM) 200 MG tablet Take 1 tablet (200 mg total) by mouth 3 (three) times daily as needed for pain. 05/28/16   Frederich Cha, MD  phentermine (ADIPEX-P) 37.5 MG tablet Take 37.5 mg by mouth daily. 11/12/14   Historical Provider, MD  sertraline (ZOLOFT) 25 MG tablet 1/2 tablet daily for six days, then 1 daily for 6 days, then 2 daily Patient not taking: Reported on 10/21/2015 02/19/15   Birdie Sons, MD    Allergies Patient has no known allergies.  Family History  Problem Relation Age of Onset  . Colon polyps Father   . Hypertension Brother   . Diabetes Daughter 10    type 1  .  Diabetes Maternal Aunt   . Stroke Paternal Grandfather   . Alzheimer's disease Paternal Grandfather     Social History Social History  Substance Use Topics  . Smoking status: Never Smoker  . Smokeless tobacco: Never Used  . Alcohol use Not on file    Review of Systems Constitutional: No fever/chills Eyes: No visual changes. ENT: No sore throat. Cardiovascular: Denies chest pain. Respiratory: Denies shortness of breath. Gastrointestinal: No abdominal pain.  No nausea, no vomiting.  No diarrhea.  No constipation. Genitourinary: Negative for dysuria. Musculoskeletal: Negative for back pain. Integumentary:  Negative for rash. Neurological: Mild headache, now resolved.  No focal weakness or numbness. Mild lightheadedness earlier, now resolved.   ____________________________________________   PHYSICAL EXAM:  VITAL SIGNS: ED Triage Vitals [07/02/16 0027]  Enc Vitals Group     BP (!) 154/95     Pulse Rate 61     Resp 18     Temp 98.5 F (36.9 C)     Temp Source Oral     SpO2 100 %     Weight 240 lb (108.9 kg)     Height      Head Circumference      Peak Flow      Pain Score      Pain Loc      Pain Edu?      Excl. in [place]?     Constitutional: Alert and oriented. Well appearing and in no acute distress. Eyes: Conjunctivae are normal. PERRL. EOMI. Head: Atraumatic. Nose: No congestion/rhinnorhea. Mouth/Throat: Mucous membranes are moist. Neck: No stridor.  No meningeal signs.   Cardiovascular: Normal rate, regular rhythm. Good peripheral circulation. Grossly normal heart sounds. Respiratory: Normal respiratory effort.  No retractions. Lungs CTAB. Gastrointestinal: Soft and nontender. No distention.  Musculoskeletal: No lower extremity tenderness nor edema. No gross deformities of extremities. Neurologic:  Normal speech and language. No gross focal neurologic deficits are appreciated.  Skin:  Skin is warm, dry and intact. No rash noted. Psychiatric: Mood and affect are normal. Speech and behavior are normal.  ____________________________________________   LABS (all labs ordered are listed, but only abnormal results are displayed)  Labs Reviewed  BASIC METABOLIC PANEL - Abnormal; Notable for the following:       Result Value   Potassium 3.2 (*)    All other components within normal limits  CBC - Abnormal; Notable for the following:    Hemoglobin 11.8 (*)    RDW 15.8 (*)    All other components within normal limits  URINALYSIS, COMPLETE (UACMP) WITH MICROSCOPIC - Abnormal; Notable for the following:    Color, Urine STRAW (*)    APPearance CLEAR (*)    Specific Gravity,  Urine 1.004 (*)    All other components within normal limits  POC URINE PREG, ED  POCT PREGNANCY, URINE  CBG MONITORING, ED   ____________________________________________  EKG  ED ECG REPORT I, Emiliya Chretien, the attending physician, personally viewed and interpreted this ECG.  Date: 07/02/2016 EKG Time: 00:32 Rate: 60 Rhythm: normal sinus rhythm QRS Axis: normal Intervals: normal ST/T Wave abnormalities: normal Conduction Disturbances: none Narrative Interpretation: unremarkable  ____________________________________________  RADIOLOGY   No results found.  ____________________________________________   PROCEDURES  Critical Care performed: No   Procedure(s) performed:   Procedures   ____________________________________________   INITIAL IMPRESSION / ASSESSMENT AND PLAN / ED COURSE  Pertinent labs & imaging results that were available during my care of the patient were reviewed by me and considered in my  medical decision making (see chart for details).  The patient may suffer from essential hypertension but her current blood pressure is 128/75 and she is asymptomatic.  Nothing concerning on her lab workup nor EKG.  No evidence of acute or emergent medical condition.  I had my usual customary hypertension discussion with the patient and her mother.  I encouraged her to drink plenty of fluids, take her regular medications, and follow up next week as already planned for some thyroid function tests and a thyroid ultrasound as instigated by her GYN.  I also encouraged her to call her PCP today in the office opens to schedule follow-up appointment.  I gave my usual and customary return precautions.  Patient and mother understand and agree with the plan.      ____________________________________________  FINAL CLINICAL IMPRESSION(S) / ED DIAGNOSES  Final diagnoses:  Hypertension, unspecified type  Lightheadedness     MEDICATIONS GIVEN DURING THIS  VISIT:  Medications - No data to display   NEW OUTPATIENT MEDICATIONS STARTED DURING THIS VISIT:  New Prescriptions   No medications on file    Modified Medications   No medications on file    Discontinued Medications   No medications on file     Note:  This document was prepared using Dragon voice recognition software and may include unintentional dictation errors.    Hinda Kehr, MD 07/02/16 [phone]

## 2016-07-05 DIAGNOSIS — G5603 Carpal tunnel syndrome, bilateral upper limbs: Secondary | ICD-10-CM | POA: Diagnosis not present

## 2016-07-05 DIAGNOSIS — Z01419 Encounter for gynecological examination (general) (routine) without abnormal findings: Secondary | ICD-10-CM | POA: Diagnosis not present

## 2016-07-05 DIAGNOSIS — E049 Nontoxic goiter, unspecified: Secondary | ICD-10-CM | POA: Diagnosis not present

## 2016-07-09 DIAGNOSIS — E049 Nontoxic goiter, unspecified: Secondary | ICD-10-CM | POA: Diagnosis not present

## 2016-07-13 DIAGNOSIS — G5603 Carpal tunnel syndrome, bilateral upper limbs: Secondary | ICD-10-CM | POA: Diagnosis not present

## 2016-07-22 DIAGNOSIS — M722 Plantar fascial fibromatosis: Secondary | ICD-10-CM | POA: Diagnosis not present

## 2016-07-28 DIAGNOSIS — G5603 Carpal tunnel syndrome, bilateral upper limbs: Secondary | ICD-10-CM | POA: Diagnosis not present

## 2016-08-04 DIAGNOSIS — E042 Nontoxic multinodular goiter: Secondary | ICD-10-CM | POA: Diagnosis not present

## 2016-08-05 DIAGNOSIS — E042 Nontoxic multinodular goiter: Secondary | ICD-10-CM | POA: Diagnosis not present

## 2016-08-07 DIAGNOSIS — E042 Nontoxic multinodular goiter: Secondary | ICD-10-CM | POA: Diagnosis not present

## 2016-10-27 ENCOUNTER — Encounter: Payer: Self-pay | Admitting: *Deleted

## 2017-01-18 ENCOUNTER — Ambulatory Visit: Admission: RE | Admit: 2017-01-18 | Payer: 59 | Source: Ambulatory Visit

## 2017-02-15 DIAGNOSIS — E042 Nontoxic multinodular goiter: Secondary | ICD-10-CM | POA: Diagnosis not present

## 2017-02-22 DIAGNOSIS — E042 Nontoxic multinodular goiter: Secondary | ICD-10-CM | POA: Diagnosis not present

## 2017-05-06 ENCOUNTER — Ambulatory Visit (INDEPENDENT_AMBULATORY_CARE_PROVIDER_SITE_OTHER): Payer: 59 | Admitting: Family Medicine

## 2017-05-06 ENCOUNTER — Encounter: Payer: Self-pay | Admitting: Family Medicine

## 2017-05-06 VITALS — BP 110/68 | HR 68 | Temp 98.5°F | Resp 16 | Ht 61.5 in | Wt 231.0 lb

## 2017-05-06 DIAGNOSIS — J029 Acute pharyngitis, unspecified: Secondary | ICD-10-CM | POA: Diagnosis not present

## 2017-05-06 DIAGNOSIS — J069 Acute upper respiratory infection, unspecified: Secondary | ICD-10-CM | POA: Diagnosis not present

## 2017-05-06 LAB — POCT RAPID STREP A (OFFICE): RAPID STREP A SCREEN: NEGATIVE

## 2017-05-06 NOTE — Patient Instructions (Signed)
Upper Respiratory Infection, Adult Most upper respiratory infections (URIs) are caused by a virus. A URI affects the nose, throat, and upper air passages. The most common type of URI is often called "the common cold." Follow these instructions at home:  Take medicines only as told by your doctor.  Gargle warm saltwater or take cough drops to comfort your throat as told by your doctor.  Use a warm mist humidifier or inhale steam from a shower to increase air moisture. This may make it easier to breathe.  Drink enough fluid to keep your pee (urine) clear or pale yellow.  Eat soups and other clear broths.  Have a healthy diet.  Rest as needed.  Go back to work when your fever is gone or your doctor says it is okay. ? You may need to stay home longer to avoid giving your URI to others. ? You can also wear a face mask and wash your hands often to prevent spread of the virus.  Use your inhaler more if you have asthma.  Do not use any tobacco products, including cigarettes, chewing tobacco, or electronic cigarettes. If you need help quitting, ask your doctor. Contact a doctor if:  You are getting worse, not better.  Your symptoms are not helped by medicine.  You have chills.  You are getting more short of breath.  You have brown or red mucus.  You have yellow or brown discharge from your nose.  You have pain in your face, especially when you bend forward.  You have a fever.  You have puffy (swollen) neck glands.  You have pain while swallowing.  You have white areas in the back of your throat. Get help right away if:  You have very bad or constant: ? Headache. ? Ear pain. ? Pain in your forehead, behind your eyes, and over your cheekbones (sinus pain). ? Chest pain.  You have long-lasting (chronic) lung disease and any of the following: ? Wheezing. ? Long-lasting cough. ? Coughing up blood. ? A change in your usual mucus.  You have a stiff neck.  You have  changes in your: ? Vision. ? Hearing. ? Thinking. ? Mood. This information is not intended to replace advice given to you by your health care provider. Make sure you discuss any questions you have with your health care provider. Document Released: 08/11/2007 Document Revised: 10/26/2015 Document Reviewed: 05/30/2013 Elsevier Interactive Patient Education  2018 [employer] Inc[employer]  

## 2017-05-06 NOTE — Progress Notes (Signed)
       Patient: Savannah Kelly Female    DOB: Apr 19, 1975   42 y.o.   MRN: [id number] Visit Date: 05/06/2017  Today's Provider: Lelon Huh, MD   Chief Complaint  Patient presents with  . URI   Subjective:    Sore Throat   This is a new problem. The current episode started in the past 7 days (about 2 days). There has been no fever. Associated symptoms include congestion, coughing, ear pain, headaches, a hoarse voice and a plugged ear sensation. Pertinent negatives include no abdominal pain, shortness of breath or vomiting. She has had no exposure to strep or mono. She has tried nothing for the symptoms.       No Known Allergies   Current Outpatient Medications:  .  fluticasone (FLONASE) 50 MCG/ACT nasal spray, Place 2 sprays into both nostrils daily., Disp: 16 g, Rfl: 6 .  metoprolol succinate (TOPROL-XL) 25 MG 24 hr tablet, TAKE 1 TABLET (25 MG TOTAL) BY MOUTH DAILY. (Patient not taking: Reported on 10/21/2015), Disp: 30 tablet, Rfl: 1  Review of Systems  Constitutional: Positive for fatigue. Negative for appetite change, chills and fever.  HENT: Positive for congestion, ear pain, hoarse voice and sore throat.   Respiratory: Positive for cough. Negative for chest tightness and shortness of breath.   Cardiovascular: Negative for chest pain and palpitations.  Gastrointestinal: Negative for abdominal pain, nausea and vomiting.  Neurological: Positive for headaches. Negative for dizziness and weakness.    Social History   Tobacco Use  . Smoking status: Never Smoker  . Smokeless tobacco: Never Used  Substance Use Topics  . Alcohol use: Not on file   Objective:   BP 110/68 (BP Location: Left Arm, Patient Position: Sitting, Cuff Size: Large)   Pulse 68   Temp 98.5 F (36.9 C)   Resp 16   Ht 5' 1.5" (1.562 m)   Wt 231 lb (104.8 kg)   BMI 42.94 kg/m  Vitals:   05/06/17 1113  BP: 110/68  Pulse: 68  Resp: 16  Temp: 98.5 F (36.9 C)  Weight: 231 lb (104.8 kg)  Height:  5' 1.5" (1.562 m)     Physical Exam  General Appearance:    Alert, cooperative, no distress  HENT:   bilateral TM normal without fluid or infection, neck has bilateral anterior cervical nodes enlarged and pharynx erythematous without exudate  Eyes:    PERRL, conjunctiva/corneas clear, EOM's intact       Lungs:     Clear to auscultation bilaterally, respirations unlabored  Heart:    Regular rate and rhythm  Neurologic:   Awake, alert, oriented x 3. No apparent focal neurological           defect.       Results for orders placed or performed in visit on 05/06/17  POCT rapid strep A  Result Value Ref Range   Rapid Strep A Screen Negative Negative       Assessment & Plan:     1. Sore throat Counseled regarding signs and symptoms of viral and bacterial respiratory infections. Advised to call or return for additional evaluation if she develops any sign of bacterial infection, or if current symptoms last longer than 10 days.   - POCT rapid strep A  2. Upper respiratory tract infection, unspecified type        Lelon Huh, MD  [facility]

## 2017-06-01 DIAGNOSIS — H43813 Vitreous degeneration, bilateral: Secondary | ICD-10-CM | POA: Diagnosis not present

## 2017-09-05 ENCOUNTER — Other Ambulatory Visit: Payer: Self-pay | Admitting: Obstetrics and Gynecology

## 2017-09-05 DIAGNOSIS — Z01419 Encounter for gynecological examination (general) (routine) without abnormal findings: Secondary | ICD-10-CM | POA: Diagnosis not present

## 2017-09-05 DIAGNOSIS — I10 Essential (primary) hypertension: Secondary | ICD-10-CM | POA: Diagnosis not present

## 2017-09-05 DIAGNOSIS — Z124 Encounter for screening for malignant neoplasm of cervix: Secondary | ICD-10-CM | POA: Diagnosis not present

## 2017-09-05 DIAGNOSIS — Z1231 Encounter for screening mammogram for malignant neoplasm of breast: Secondary | ICD-10-CM

## 2017-09-07 DIAGNOSIS — S76012A Strain of muscle, fascia and tendon of left hip, initial encounter: Secondary | ICD-10-CM | POA: Diagnosis not present

## 2017-09-09 DIAGNOSIS — Z01419 Encounter for gynecological examination (general) (routine) without abnormal findings: Secondary | ICD-10-CM | POA: Diagnosis not present

## 2017-09-16 DIAGNOSIS — Z1211 Encounter for screening for malignant neoplasm of colon: Secondary | ICD-10-CM | POA: Diagnosis not present

## 2017-09-20 ENCOUNTER — Ambulatory Visit
Admission: RE | Admit: 2017-09-20 | Discharge: 2017-09-20 | Disposition: A | Discharge status: Home / Self Care | Payer: 59 | Source: Ambulatory Visit | Attending: Obstetrics and Gynecology | Admitting: Obstetrics and Gynecology

## 2017-09-20 ENCOUNTER — Encounter (INDEPENDENT_AMBULATORY_CARE_PROVIDER_SITE_OTHER): Payer: Self-pay

## 2017-09-20 DIAGNOSIS — Z1231 Encounter for screening mammogram for malignant neoplasm of breast: Secondary | ICD-10-CM | POA: Insufficient documentation

## 2017-09-21 ENCOUNTER — Other Ambulatory Visit: Payer: Self-pay | Admitting: Obstetrics and Gynecology

## 2017-09-21 DIAGNOSIS — R928 Other abnormal and inconclusive findings on diagnostic imaging of breast: Secondary | ICD-10-CM

## 2017-09-21 DIAGNOSIS — N6310 Unspecified lump in the right breast, unspecified quadrant: Secondary | ICD-10-CM

## 2017-09-29 ENCOUNTER — Ambulatory Visit
Admission: RE | Admit: 2017-09-29 | Discharge: 2017-09-29 | Disposition: A | Discharge status: Home / Self Care | Payer: 59 | Source: Ambulatory Visit | Attending: Obstetrics and Gynecology | Admitting: Obstetrics and Gynecology

## 2017-09-29 DIAGNOSIS — N6310 Unspecified lump in the right breast, unspecified quadrant: Secondary | ICD-10-CM

## 2017-09-29 DIAGNOSIS — N6311 Unspecified lump in the right breast, upper outer quadrant: Secondary | ICD-10-CM | POA: Diagnosis not present

## 2017-09-29 DIAGNOSIS — R928 Other abnormal and inconclusive findings on diagnostic imaging of breast: Secondary | ICD-10-CM | POA: Diagnosis not present

## 2017-09-30 ENCOUNTER — Other Ambulatory Visit: Payer: Self-pay | Admitting: *Deleted

## 2017-09-30 ENCOUNTER — Inpatient Hospital Stay
Admission: RE | Admit: 2017-09-30 | Discharge: 2017-09-30 | Disposition: A | Discharge status: Home / Self Care | Payer: Self-pay | Source: Ambulatory Visit | Attending: *Deleted | Admitting: *Deleted

## 2017-09-30 DIAGNOSIS — Z9289 Personal history of other medical treatment: Secondary | ICD-10-CM

## 2017-10-03 ENCOUNTER — Other Ambulatory Visit: Payer: Self-pay | Admitting: Obstetrics and Gynecology

## 2017-11-16 ENCOUNTER — Other Ambulatory Visit: Payer: Self-pay | Admitting: Obstetrics and Gynecology

## 2017-11-16 DIAGNOSIS — N63 Unspecified lump in unspecified breast: Secondary | ICD-10-CM | POA: Diagnosis not present

## 2017-11-24 ENCOUNTER — Ambulatory Visit
Admission: RE | Admit: 2017-11-24 | Discharge: 2017-11-24 | Disposition: A | Discharge status: Home / Self Care | Payer: 59 | Source: Ambulatory Visit | Attending: Obstetrics and Gynecology | Admitting: Obstetrics and Gynecology

## 2017-11-24 DIAGNOSIS — N63 Unspecified lump in unspecified breast: Secondary | ICD-10-CM | POA: Diagnosis not present

## 2017-11-24 DIAGNOSIS — N6311 Unspecified lump in the right breast, upper outer quadrant: Secondary | ICD-10-CM | POA: Diagnosis not present

## 2017-11-24 DIAGNOSIS — R928 Other abnormal and inconclusive findings on diagnostic imaging of breast: Secondary | ICD-10-CM | POA: Diagnosis not present

## 2018-02-14 DIAGNOSIS — E042 Nontoxic multinodular goiter: Secondary | ICD-10-CM | POA: Diagnosis not present

## 2018-02-21 DIAGNOSIS — E0520 Thyrotoxicosis with toxic multinodular goiter without thyrotoxic crisis or storm: Secondary | ICD-10-CM | POA: Diagnosis not present

## 2018-09-13 ENCOUNTER — Other Ambulatory Visit: Payer: Self-pay | Admitting: Obstetrics and Gynecology

## 2018-09-13 DIAGNOSIS — Z01419 Encounter for gynecological examination (general) (routine) without abnormal findings: Secondary | ICD-10-CM | POA: Diagnosis not present

## 2018-09-13 DIAGNOSIS — N6310 Unspecified lump in the right breast, unspecified quadrant: Secondary | ICD-10-CM | POA: Diagnosis not present

## 2018-09-13 DIAGNOSIS — N644 Mastodynia: Secondary | ICD-10-CM | POA: Diagnosis not present

## 2018-09-13 DIAGNOSIS — E8881 Metabolic syndrome: Secondary | ICD-10-CM | POA: Diagnosis not present

## 2018-09-13 DIAGNOSIS — N852 Hypertrophy of uterus: Secondary | ICD-10-CM | POA: Diagnosis not present

## 2018-09-13 DIAGNOSIS — Z124 Encounter for screening for malignant neoplasm of cervix: Secondary | ICD-10-CM | POA: Diagnosis not present

## 2018-09-20 DIAGNOSIS — N852 Hypertrophy of uterus: Secondary | ICD-10-CM | POA: Diagnosis not present

## 2018-09-20 DIAGNOSIS — D251 Intramural leiomyoma of uterus: Secondary | ICD-10-CM | POA: Diagnosis not present

## 2018-09-27 DIAGNOSIS — E8881 Metabolic syndrome: Secondary | ICD-10-CM | POA: Diagnosis not present

## 2018-09-28 ENCOUNTER — Ambulatory Visit
Admission: RE | Admit: 2018-09-28 | Discharge: 2018-09-28 | Disposition: A | Discharge status: Home / Self Care | Payer: 59 | Source: Ambulatory Visit | Attending: Obstetrics and Gynecology | Admitting: Obstetrics and Gynecology

## 2018-09-28 DIAGNOSIS — N6310 Unspecified lump in the right breast, unspecified quadrant: Secondary | ICD-10-CM

## 2018-09-28 DIAGNOSIS — R928 Other abnormal and inconclusive findings on diagnostic imaging of breast: Secondary | ICD-10-CM | POA: Diagnosis not present

## 2018-09-28 DIAGNOSIS — N6311 Unspecified lump in the right breast, upper outer quadrant: Secondary | ICD-10-CM | POA: Diagnosis not present

## 2018-10-03 ENCOUNTER — Other Ambulatory Visit: Payer: 59

## 2018-10-03 ENCOUNTER — Other Ambulatory Visit: Payer: Self-pay | Admitting: Obstetrics and Gynecology

## 2018-10-03 DIAGNOSIS — N6310 Unspecified lump in the right breast, unspecified quadrant: Secondary | ICD-10-CM

## 2018-10-03 DIAGNOSIS — R928 Other abnormal and inconclusive findings on diagnostic imaging of breast: Secondary | ICD-10-CM

## 2018-10-13 ENCOUNTER — Ambulatory Visit
Admission: RE | Admit: 2018-10-13 | Discharge: 2018-10-13 | Disposition: A | Discharge status: Home / Self Care | Payer: 59 | Source: Ambulatory Visit | Attending: Obstetrics and Gynecology | Admitting: Obstetrics and Gynecology

## 2018-10-13 ENCOUNTER — Other Ambulatory Visit: Payer: Self-pay

## 2018-10-13 DIAGNOSIS — R928 Other abnormal and inconclusive findings on diagnostic imaging of breast: Secondary | ICD-10-CM | POA: Insufficient documentation

## 2018-10-13 DIAGNOSIS — N6310 Unspecified lump in the right breast, unspecified quadrant: Secondary | ICD-10-CM

## 2018-10-13 DIAGNOSIS — D241 Benign neoplasm of right breast: Secondary | ICD-10-CM | POA: Diagnosis not present

## 2018-10-13 DIAGNOSIS — N6311 Unspecified lump in the right breast, upper outer quadrant: Secondary | ICD-10-CM | POA: Diagnosis not present

## 2018-10-13 HISTORY — PX: BREAST BIOPSY: SHX20

## 2018-10-16 LAB — SURGICAL PATHOLOGY

## 2019-02-27 DIAGNOSIS — E0520 Thyrotoxicosis with toxic multinodular goiter without thyrotoxic crisis or storm: Secondary | ICD-10-CM | POA: Diagnosis not present

## 2019-06-06 DIAGNOSIS — H18613 Keratoconus, stable, bilateral: Secondary | ICD-10-CM | POA: Diagnosis not present

## 2019-06-06 DIAGNOSIS — H43813 Vitreous degeneration, bilateral: Secondary | ICD-10-CM | POA: Diagnosis not present

## 2019-09-18 ENCOUNTER — Other Ambulatory Visit: Payer: Self-pay | Admitting: Obstetrics and Gynecology

## 2019-09-18 DIAGNOSIS — Z1331 Encounter for screening for depression: Secondary | ICD-10-CM | POA: Diagnosis not present

## 2019-09-18 DIAGNOSIS — N6310 Unspecified lump in the right breast, unspecified quadrant: Secondary | ICD-10-CM

## 2019-09-18 DIAGNOSIS — E669 Obesity, unspecified: Secondary | ICD-10-CM | POA: Diagnosis not present

## 2019-09-18 DIAGNOSIS — Z01419 Encounter for gynecological examination (general) (routine) without abnormal findings: Secondary | ICD-10-CM | POA: Diagnosis not present

## 2019-10-03 ENCOUNTER — Ambulatory Visit
Admission: RE | Admit: 2019-10-03 | Discharge: 2019-10-03 | Disposition: A | Discharge status: Home / Self Care | Payer: 59 | Source: Ambulatory Visit | Attending: Obstetrics and Gynecology | Admitting: Obstetrics and Gynecology

## 2019-10-03 DIAGNOSIS — N6310 Unspecified lump in the right breast, unspecified quadrant: Secondary | ICD-10-CM

## 2019-10-03 DIAGNOSIS — R922 Inconclusive mammogram: Secondary | ICD-10-CM | POA: Diagnosis not present

## 2019-10-03 DIAGNOSIS — N6311 Unspecified lump in the right breast, upper outer quadrant: Secondary | ICD-10-CM | POA: Diagnosis not present

## 2019-11-23 DIAGNOSIS — E669 Obesity, unspecified: Secondary | ICD-10-CM | POA: Diagnosis not present

## 2019-12-25 DIAGNOSIS — E669 Obesity, unspecified: Secondary | ICD-10-CM | POA: Diagnosis not present

## 2020-03-04 DIAGNOSIS — E0520 Thyrotoxicosis with toxic multinodular goiter without thyrotoxic crisis or storm: Secondary | ICD-10-CM | POA: Diagnosis not present

## 2020-03-05 DIAGNOSIS — H18613 Keratoconus, stable, bilateral: Secondary | ICD-10-CM | POA: Diagnosis not present

## 2020-03-05 DIAGNOSIS — H43813 Vitreous degeneration, bilateral: Secondary | ICD-10-CM | POA: Diagnosis not present

## 2021-01-14 ENCOUNTER — Other Ambulatory Visit: Payer: Self-pay | Admitting: Obstetrics and Gynecology

## 2021-01-14 DIAGNOSIS — Z1231 Encounter for screening mammogram for malignant neoplasm of breast: Secondary | ICD-10-CM

## 2021-03-11 ENCOUNTER — Other Ambulatory Visit: Payer: Self-pay

## 2021-03-11 ENCOUNTER — Ambulatory Visit
Admission: RE | Admit: 2021-03-11 | Discharge: 2021-03-11 | Disposition: A | Discharge status: Home / Self Care | Payer: 59 | Source: Ambulatory Visit | Attending: Obstetrics and Gynecology | Admitting: Obstetrics and Gynecology

## 2021-03-11 DIAGNOSIS — Z1231 Encounter for screening mammogram for malignant neoplasm of breast: Secondary | ICD-10-CM | POA: Insufficient documentation

## 2022-02-01 ENCOUNTER — Other Ambulatory Visit: Payer: Self-pay | Admitting: Obstetrics and Gynecology

## 2022-02-01 DIAGNOSIS — Z1231 Encounter for screening mammogram for malignant neoplasm of breast: Secondary | ICD-10-CM

## 2022-03-12 ENCOUNTER — Ambulatory Visit
Admission: RE | Admit: 2022-03-12 | Discharge: 2022-03-12 | Disposition: A | Discharge status: Home / Self Care | Payer: 59 | Source: Ambulatory Visit | Attending: Obstetrics and Gynecology | Admitting: Obstetrics and Gynecology

## 2022-03-12 DIAGNOSIS — Z1231 Encounter for screening mammogram for malignant neoplasm of breast: Secondary | ICD-10-CM | POA: Diagnosis not present

## 2022-03-17 DIAGNOSIS — E0520 Thyrotoxicosis with toxic multinodular goiter without thyrotoxic crisis or storm: Secondary | ICD-10-CM | POA: Diagnosis not present

## 2022-03-18 DIAGNOSIS — E0520 Thyrotoxicosis with toxic multinodular goiter without thyrotoxic crisis or storm: Secondary | ICD-10-CM | POA: Diagnosis not present

## 2022-03-18 DIAGNOSIS — D500 Iron deficiency anemia secondary to blood loss (chronic): Secondary | ICD-10-CM | POA: Diagnosis not present

## 2022-04-21 ENCOUNTER — Other Ambulatory Visit: Payer: Self-pay | Admitting: Obstetrics and Gynecology

## 2022-04-21 DIAGNOSIS — N920 Excessive and frequent menstruation with regular cycle: Secondary | ICD-10-CM | POA: Diagnosis not present

## 2022-04-21 DIAGNOSIS — D251 Intramural leiomyoma of uterus: Secondary | ICD-10-CM | POA: Diagnosis not present

## 2022-04-21 MED ORDER — SODIUM CHLORIDE 0.9 % IV SOLN
300.0000 mg | INTRAVENOUS | Status: DC
  Filled 2022-04-21: qty 15

## 2022-04-21 NOTE — Progress Notes (Signed)
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

## 2022-04-23 ENCOUNTER — Ambulatory Visit
Admission: RE | Admit: 2022-04-23 | Discharge: 2022-04-23 | Disposition: A | Discharge status: Home / Self Care | Payer: 59 | Source: Ambulatory Visit | Attending: Obstetrics and Gynecology | Admitting: Obstetrics and Gynecology

## 2022-04-23 DIAGNOSIS — D500 Iron deficiency anemia secondary to blood loss (chronic): Secondary | ICD-10-CM | POA: Diagnosis not present

## 2022-04-23 DIAGNOSIS — N950 Postmenopausal bleeding: Secondary | ICD-10-CM | POA: Diagnosis not present

## 2022-04-23 DIAGNOSIS — D509 Iron deficiency anemia, unspecified: Secondary | ICD-10-CM | POA: Diagnosis present

## 2022-04-23 MED ORDER — SODIUM CHLORIDE 0.9 % IV SOLN
300.0000 mg | INTRAVENOUS | Status: DC
  Administered 2022-04-23: 300 mg via INTRAVENOUS
  Filled 2022-04-23: qty 300

## 2022-04-29 ENCOUNTER — Ambulatory Visit
Admission: RE | Admit: 2022-04-29 | Discharge: 2022-04-29 | Disposition: A | Discharge status: Home / Self Care | Payer: 59 | Source: Ambulatory Visit | Attending: Obstetrics and Gynecology | Admitting: Obstetrics and Gynecology

## 2022-04-29 DIAGNOSIS — D509 Iron deficiency anemia, unspecified: Secondary | ICD-10-CM | POA: Diagnosis not present

## 2022-04-29 MED ORDER — SODIUM CHLORIDE 0.9 % IV SOLN
300.0000 mg | Freq: Once | INTRAVENOUS | Status: AC
  Administered 2022-04-29: 300 mg via INTRAVENOUS
  Filled 2022-04-29: qty 300

## 2022-05-03 DIAGNOSIS — D250 Submucous leiomyoma of uterus: Secondary | ICD-10-CM | POA: Diagnosis not present

## 2022-05-03 DIAGNOSIS — N920 Excessive and frequent menstruation with regular cycle: Secondary | ICD-10-CM | POA: Diagnosis not present

## 2022-05-04 ENCOUNTER — Other Ambulatory Visit: Payer: Self-pay | Admitting: Obstetrics and Gynecology

## 2022-05-07 ENCOUNTER — Ambulatory Visit
Admission: RE | Admit: 2022-05-07 | Discharge: 2022-05-07 | Disposition: A | Discharge status: Home / Self Care | Payer: 59 | Source: Ambulatory Visit | Attending: Obstetrics and Gynecology | Admitting: Obstetrics and Gynecology

## 2022-05-07 DIAGNOSIS — N920 Excessive and frequent menstruation with regular cycle: Secondary | ICD-10-CM | POA: Insufficient documentation

## 2022-05-07 MED ORDER — SODIUM CHLORIDE 0.9 % IV SOLN
300.0000 mg | Freq: Once | INTRAVENOUS | Status: AC
  Administered 2022-05-07: 300 mg via INTRAVENOUS
  Filled 2022-05-07: qty 300

## 2022-05-10 NOTE — H&P (Signed)
Savannah Kelly is a 47 y.o. female here Hysteroscopic resection of endometrial submucosal and follow on endometrial novasure ablation .   Pt here for continued work up for menorrhagia . Iron deficiency anemia . She has undergone 2 venofer tx .   Embx : Comment: Part A-Endometrial Biopsy: PROLIFERATIVE ENDOMETRIUM. NO EVIDENCE OF HYPERPLASIA, ENDOMETRITIS OR ATYPIA.  Past Medical History:  has a past medical history of Anemia, Hemorrhoids, Multinodular goiter, Obesity, Ovarian cyst, and Prediabetes.  Past Surgical History:  has a past surgical history that includes Cesarean section; left carpal tunnel release (Left, 07/13/2016); right carpal tunnel release (Right, 07/28/2016); Breast biopsy (10/07/2018); and Colonoscopy (N/A, 2005). Family History: family history includes Breast cancer in her maternal aunt; Diabetes in her daughter. Social History:  reports that she has never smoked. She has never used smokeless tobacco. She reports that she does not drink alcohol and does not use drugs. OB/GYN History:  OB History       Gravida  3   Para  1   Term  1   Preterm      AB  2   Living  1        SAB  2   IAB      Ectopic      Molar      Multiple      Live Births  1             Allergies: has No Known Allergies. Medications:   Current Outpatient Medications:    acyclovir (ZOVIRAX) 400 MG tablet, Take 1 tablet (400 mg total) by mouth 3 (three) times daily Ist episode, Disp: 30 tablet, Rfl: 1   IBUPROFEN ORAL, Take by mouth once daily as needed, Disp: , Rfl:    Lactobacillus acidophilus (PROBIOTIC ORAL), Take 1 tablet by mouth once daily, Disp: , Rfl:    metFORMIN (GLUCOPHAGE) 500 MG tablet, Take 1 tablet (500 mg total) by mouth 2 (two) times daily with meals, Disp: 60 tablet, Rfl: 11   iron polysaccharides (FERREX) 150 mg iron capsule, Take 1 capsule (150 mg total) by mouth once daily (Patient not taking: Reported on 04/21/2022), Disp: 30 capsule, Rfl: 5   Review of  Systems: General:                      No fatigue or weight loss Eyes:                           No vision changes Ears:                            No hearing difficulty Respiratory:                No cough or shortness of breath Pulmonary:                  No asthma or shortness of breath Cardiovascular:           No chest pain, palpitations, dyspnea on exertion Gastrointestinal:          No abdominal bloating, chronic diarrhea, constipations, masses, pain or hematochezia Genitourinary:             No hematuria, dysuria, abnormal vaginal discharge, pelvic pain, Menometrorrhagia Lymphatic:                   No swollen lymph nodes Musculoskeletal:  No muscle weakness Neurologic:                  No extremity weakness, syncope, seizure disorder Psychiatric:                  No history of depression, delusions or suicidal/homicidal ideation      Exam:       Vitals:    05/03/22 0925  BP: 134/89  Pulse: 99      Body mass index is 36.09 kg/m.   WDWN black female in NAD   Lungs: CTA  CV : RRR without murmur   Breast: exam done in sitting and lying position : No dimpling or retraction, no dominant mass, no spontaneous discharge, no axillary adenopathy Neck:  no thyromegaly Abdomen: soft , no mass, normal active bowel sounds,  non-tender, no rebound tenderness Pelvic: tanner stage 5 ,  External genitalia: vulva /labia no lesions Urethra: no prolapse Vagina: normal physiologic d/c Cervix: no lesions, no cervical motion tenderness   Uterus: normal size shape and contour, non-tender Adnexa: no mass,  non-tender   Saline infusion sonohysterography: betadine prep to the cervix followed by placement of the HSG catheter into the endometrial canal . Sterile H2O is injected while performing a transvaginal u/s . Findings:  Uterus anteverted   Endometrium=9.50m   Rt ovary volume=149mLeft ovary appears wnl Rt simple ovarian cyst=1.6cm Free fluid seen in PCDS   Fibroids seen:1)Lt  posterior=3.1cm     2)Rt lateral=2.8cm     3)fundal=2.3cm    4)posterior=2.9cm   Saline potion :  Pelvic u/s performed on 2/13   Endometrial fibroid seen: 2.25 x 2.25 x 1.89cm  submucosal Impression:    The primary encounter diagnosis was Menorrhagia with regular cycle. A diagnosis of Fibroids, submucosal was also pertinent to this visit.       Plan:  Options of myosure resection with follow on Novasure   .. I have explained the procedure to her in detail .  Risks discussed with pt - see KCMentoneotes             THCaroline SaugerMD

## 2022-05-13 ENCOUNTER — Encounter
Admission: RE | Admit: 2022-05-13 | Discharge: 2022-05-13 | Disposition: A | Discharge status: Home / Self Care | Payer: 59 | Source: Ambulatory Visit | Attending: Obstetrics and Gynecology | Admitting: Obstetrics and Gynecology

## 2022-05-13 DIAGNOSIS — Z01818 Encounter for other preprocedural examination: Secondary | ICD-10-CM

## 2022-05-13 HISTORY — DX: Unspecified ovarian cyst, unspecified side: N83.209

## 2022-05-13 HISTORY — DX: Unspecified hemorrhoids: K64.9

## 2022-05-13 HISTORY — DX: Excessive and frequent menstruation with regular cycle: N92.0

## 2022-05-13 HISTORY — DX: Sleep apnea, unspecified: G47.30

## 2022-05-13 HISTORY — DX: Submucous leiomyoma of uterus: D25.0

## 2022-05-13 HISTORY — DX: Cardiac arrhythmia, unspecified: I49.9

## 2022-05-13 HISTORY — DX: Nontoxic goiter, unspecified: E04.9

## 2022-05-13 HISTORY — DX: Prediabetes: R73.03

## 2022-05-13 NOTE — Patient Instructions (Signed)
Your procedure is scheduled on:05-20-22 Thursday Report to the Registration Desk on the 1st floor of the [facility].Then proceed to the 2nd floor Surgery Desk To find out your arrival time, please call [phone] between 1PM - 3PM on:05-19-22 Wednesday If your arrival time is 6:00 am, do not arrive before that time as the [facility] entrance doors do not open until 6:00 am.  REMEMBER: Instructions that are not followed completely may result in serious medical risk, up to and including death; or upon the discretion of your surgeon and anesthesiologist your surgery may need to be rescheduled.  Do not eat food after midnight the night before surgery.  No gum chewing or hard candies.  You may however, drink CLEAR liquids up to 2 hours before you are scheduled to arrive for your surgery. Do not drink anything within 2 hours of your scheduled arrival time.  Clear liquids include: - water  - apple juice without pulp - gatorade (not RED colors) - black coffee or tea (Do NOT add milk or creamers to the coffee or tea) Do NOT drink anything that is not on this list.  In addition, your doctor has ordered for you to drink the provided:  Ensure Pre-Surgery Clear Carbohydrate Drink  Drinking this carbohydrate drink up to two hours before surgery helps to reduce insulin resistance and improve patient outcomes. Please complete drinking 2 hours before scheduled arrival time.  One week prior to surgery: Stop Anti-inflammatories (NSAIDS) such as Advil, Aleve, Ibuprofen, Motrin, Naproxen, Naprosyn and Aspirin based products such as Excedrin, Goody's Powder, BC Powder.You may however, take Tylenol if needed for pain up until the day of surgery.  Stop ANY OVER THE COUNTER supplements/vitamins NOW (05-13-22) until after surgery (Magnesium Glycinate)  Do NOT take any medication the day of surgery  No Alcohol for 24 hours before or after surgery.  No Smoking including e-cigarettes for 24 hours before  surgery.  No chewable tobacco products for at least 6 hours before surgery.  No nicotine patches on the day of surgery.  Do not use any "recreational" drugs for at least a week (preferably 2 weeks) before your surgery.  Please be advised that the combination of cocaine and anesthesia may have negative outcomes, up to and including death. If you test positive for cocaine, your surgery will be cancelled.  On the morning of surgery brush your teeth with toothpaste and water, you may rinse your mouth with mouthwash if you wish. Do not swallow any toothpaste or mouthwash.  Do not wear jewelry, make-up, hairpins, clips or nail polish.  Do not wear lotions, powders, or perfumes.   Do not shave body hair from the neck down 48 hours before surgery.  Contact lenses, hearing aids and dentures may not be worn into surgery.  Do not bring valuables to the hospital. [facility] is not responsible for any missing/lost belongings or valuables.   Notify your doctor if there is any change in your medical condition (cold, fever, infection).  Wear comfortable clothing (specific to your surgery type) to the hospital.  After surgery, you can help prevent lung complications by doing breathing exercises.  Take deep breaths and cough every 1-2 hours. Your doctor may order a device called an Incentive Spirometer to help you take deep breaths. When coughing or sneezing, hold a pillow firmly against your incision with both hands. This is called "splinting." Doing this helps protect your incision. It also decreases belly discomfort.  If you are being admitted to the  hospital overnight, leave your suitcase in the car. After surgery it may be brought to your room.  In case of increased patient census, it may be necessary for you, the patient, to continue your postoperative care in the Same Day Surgery department.  If you are being discharged the day of surgery, you will not be allowed to drive home. You will need  a responsible individual to drive you home and stay with you for 24 hours after surgery.   If you are taking public transportation, you will need to have a responsible individual with you.  Please call the [facility]. at [phone] if you have any questions about these instructions.  Surgery Visitation Policy:  Patients undergoing a surgery or procedure may have two family members or support persons with them as long as the person is not COVID-19 positive or experiencing its symptoms.   Due to an increase in RSV and influenza rates and associated hospitalizations, children ages 52 and under will not be able to visit patients in [facility]. Masks continue to be strongly recommended.  How to Use an Incentive Spirometer An incentive spirometer is a tool that measures how well you are filling your lungs with each breath. Learning to take long, deep breaths using this tool can help you keep your lungs clear and active. This may help to reverse or lessen your chance of developing breathing (pulmonary) problems, especially infection. You may be asked to use a spirometer: After a surgery. If you have a lung problem or a history of smoking. After a long period of time when you have been unable to move or be active. If the spirometer includes an indicator to show the highest number that you have reached, your health care provider or respiratory therapist will help you set a goal. Keep a log of your progress as told by your health care provider. What are the risks? Breathing too quickly may cause dizziness or cause you to pass out. Take your time so you do not get dizzy or light-headed. If you are in pain, you may need to take pain medicine before doing incentive spirometry. It is harder to take a deep breath if you are having pain. How to use your incentive spirometer  Sit up on the edge of your bed or on a chair. Hold the incentive spirometer so that it is in an  upright position. Before you use the spirometer, breathe out normally. Place the mouthpiece in your mouth. Make sure your lips are closed tightly around it. Breathe in slowly and as deeply as you can through your mouth, causing the piston or the ball to rise toward the top of the chamber. Hold your breath for 3-5 seconds, or for as long as possible. If the spirometer includes a coach indicator, use this to guide you in breathing. Slow down your breathing if the indicator goes above the marked areas. Remove the mouthpiece from your mouth and breathe out normally. The piston or ball will return to the bottom of the chamber. Rest for a few seconds, then repeat the steps 10 or more times. Take your time and take a few normal breaths between deep breaths so that you do not get dizzy or light-headed. Do this every 1-2 hours when you are awake. If the spirometer includes a goal marker to show the highest number you have reached (best effort), use this as a goal to work toward during each repetition. After each set of 10 deep breaths,  cough a few times. This will help to make sure that your lungs are clear. If you have an incision on your chest or abdomen from surgery, place a pillow or a rolled-up towel firmly against the incision when you cough. This can help to reduce pain while taking deep breaths and coughing. General tips When you are able to get out of bed: Walk around often. Continue to take deep breaths and cough in order to clear your lungs. Keep using the incentive spirometer until your health care provider says it is okay to stop using it. If you have been in the hospital, you may be told to keep using the spirometer at home. Contact a health care provider if: You are having difficulty using the spirometer. You have trouble using the spirometer as often as instructed. Your pain medicine is not giving enough relief for you to use the spirometer as told. You have a fever. Get help right away  if: You develop shortness of breath. You develop a cough with bloody mucus from the lungs. You have fluid or blood coming from an incision site after you cough. Summary An incentive spirometer is a tool that can help you learn to take long, deep breaths to keep your lungs clear and active. You may be asked to use a spirometer after a surgery, if you have a lung problem or a history of smoking, or if you have been inactive for a long period of time. Use your incentive spirometer as instructed every 1-2 hours while you are awake. If you have an incision on your chest or abdomen, place a pillow or a rolled-up towel firmly against your incision when you cough. This will help to reduce pain. Get help right away if you have shortness of breath, you cough up bloody mucus, or blood comes from your incision when you cough. This information is not intended to replace advice given to you by your health care provider. Make sure you discuss any questions you have with your health care provider. Document Revised: 05/14/2019 Document Reviewed: 05/14/2019 Elsevier Patient Education  [place].

## 2022-05-17 ENCOUNTER — Encounter
Admission: RE | Admit: 2022-05-17 | Discharge: 2022-05-17 | Disposition: A | Discharge status: Home / Self Care | Payer: 59 | Source: Ambulatory Visit | Attending: Obstetrics and Gynecology | Admitting: Obstetrics and Gynecology

## 2022-05-17 DIAGNOSIS — Z01818 Encounter for other preprocedural examination: Secondary | ICD-10-CM

## 2022-05-17 DIAGNOSIS — Z01812 Encounter for preprocedural laboratory examination: Secondary | ICD-10-CM | POA: Insufficient documentation

## 2022-05-17 LAB — BASIC METABOLIC PANEL
Anion gap: 6 (ref 5–15)
BUN: 14 mg/dL (ref 6–20)
CO2: 26 mmol/L (ref 22–32)
Calcium: 9 mg/dL (ref 8.9–10.3)
Chloride: 106 mmol/L (ref 98–111)
Creatinine, Ser: 0.65 mg/dL (ref 0.44–1.00)
GFR, Estimated: >60 mL/min (ref >60)
Glucose, Bld: 87 mg/dL (ref 70–99)
Potassium: 3.6 mmol/L (ref 3.5–5.1)
Sodium: 138 mmol/L (ref 135–145)

## 2022-05-17 LAB — TYPE AND SCREEN
ABO/RH(D): B POS
Antibody Screen: NEGATIVE

## 2022-05-17 LAB — CBC
HCT: 34.7 % — ABNORMAL LOW (ref 36.0–46.0)
Hemoglobin: 10 g/dL — ABNORMAL LOW (ref 12.0–15.0)
MCH: 23 pg — ABNORMAL LOW (ref 26.0–34.0)
MCHC: 28.8 g/dL — ABNORMAL LOW (ref 30.0–36.0)
MCV: 80 fL (ref 80.0–100.0)
Platelets: 428 10*3/uL — ABNORMAL HIGH (ref 150–400)
RBC: 4.34 MIL/uL (ref 3.87–5.11)
RDW: 26.4 % — ABNORMAL HIGH (ref 11.5–15.5)
WBC: 4.9 10*3/uL (ref 4.0–10.5)
nRBC: 0 % (ref 0.0–0.2)

## 2022-05-19 MED ORDER — LACTATED RINGERS IV SOLN: INTRAVENOUS | Status: DC

## 2022-05-19 MED ORDER — CHLORHEXIDINE GLUCONATE 0.12 % MT SOLN: 15.0000 mL | Freq: Once | OROMUCOSAL | Status: AC

## 2022-05-19 MED ORDER — FAMOTIDINE 20 MG PO TABS: 20.0000 mg | ORAL_TABLET | Freq: Once | ORAL | Status: AC

## 2022-05-19 MED ORDER — ORAL CARE MOUTH RINSE: 15.0000 mL | Freq: Once | OROMUCOSAL | Status: AC

## 2022-05-19 MED ORDER — POVIDONE-IODINE 10 % EX SWAB
2.0000 | Freq: Once | CUTANEOUS | Status: AC
  Administered 2022-05-20: 2 via TOPICAL

## 2022-05-19 MED ORDER — GABAPENTIN 300 MG PO CAPS: 300.0000 mg | ORAL_CAPSULE | ORAL | Status: AC

## 2022-05-19 MED ORDER — CEFAZOLIN SODIUM-DEXTROSE 2-4 GM/100ML-% IV SOLN
2.0000 g | Freq: Once | INTRAVENOUS | Status: AC
  Administered 2022-05-20: 2 g via INTRAVENOUS

## 2022-05-19 MED ORDER — ACETAMINOPHEN 500 MG PO TABS: 1000.0000 mg | ORAL_TABLET | ORAL | Status: AC

## 2022-05-20 ENCOUNTER — Encounter: Payer: Self-pay | Admitting: Obstetrics and Gynecology

## 2022-05-20 ENCOUNTER — Ambulatory Visit: Payer: 59 | Admitting: Urgent Care

## 2022-05-20 ENCOUNTER — Other Ambulatory Visit: Payer: Self-pay

## 2022-05-20 ENCOUNTER — Ambulatory Visit
Admission: RE | Admit: 2022-05-20 | Discharge: 2022-05-20 | Disposition: A | Discharge status: Home / Self Care | Payer: 59 | Source: Ambulatory Visit | Attending: Obstetrics and Gynecology | Admitting: Obstetrics and Gynecology

## 2022-05-20 ENCOUNTER — Encounter
Admission: RE | Disposition: A | Discharge status: Home / Self Care | Payer: Self-pay | Source: Ambulatory Visit | Attending: Obstetrics and Gynecology

## 2022-05-20 ENCOUNTER — Ambulatory Visit: Payer: 59 | Admitting: Registered Nurse

## 2022-05-20 DIAGNOSIS — D509 Iron deficiency anemia, unspecified: Secondary | ICD-10-CM | POA: Insufficient documentation

## 2022-05-20 DIAGNOSIS — G4730 Sleep apnea, unspecified: Secondary | ICD-10-CM | POA: Diagnosis not present

## 2022-05-20 DIAGNOSIS — Z01818 Encounter for other preprocedural examination: Secondary | ICD-10-CM

## 2022-05-20 DIAGNOSIS — E669 Obesity, unspecified: Secondary | ICD-10-CM | POA: Diagnosis not present

## 2022-05-20 DIAGNOSIS — F419 Anxiety disorder, unspecified: Secondary | ICD-10-CM | POA: Diagnosis not present

## 2022-05-20 DIAGNOSIS — D500 Iron deficiency anemia secondary to blood loss (chronic): Secondary | ICD-10-CM | POA: Diagnosis not present

## 2022-05-20 DIAGNOSIS — Z6841 Body Mass Index (BMI) 40.0 and over, adult: Secondary | ICD-10-CM | POA: Insufficient documentation

## 2022-05-20 DIAGNOSIS — N920 Excessive and frequent menstruation with regular cycle: Secondary | ICD-10-CM | POA: Diagnosis not present

## 2022-05-20 DIAGNOSIS — R7303 Prediabetes: Secondary | ICD-10-CM | POA: Diagnosis not present

## 2022-05-20 DIAGNOSIS — Z7984 Long term (current) use of oral hypoglycemic drugs: Secondary | ICD-10-CM | POA: Diagnosis not present

## 2022-05-20 DIAGNOSIS — D250 Submucous leiomyoma of uterus: Secondary | ICD-10-CM | POA: Insufficient documentation

## 2022-05-20 HISTORY — PX: DILITATION & CURRETTAGE/HYSTROSCOPY WITH NOVASURE ABLATION: SHX5568

## 2022-05-20 LAB — POCT PREGNANCY, URINE: Preg Test, Ur: NEGATIVE

## 2022-05-20 LAB — ABO/RH: ABO/RH(D): B POS

## 2022-05-20 SURGERY — DILATATION & CURETTAGE/HYSTEROSCOPY WITH NOVASURE ABLATION
Anesthesia: General | Site: Uterus

## 2022-05-20 MED ORDER — KETOROLAC TROMETHAMINE 30 MG/ML IJ SOLN
INTRAMUSCULAR | Status: DC | PRN
  Administered 2022-05-20: 30 mg via INTRAVENOUS

## 2022-05-20 MED ORDER — GABAPENTIN 300 MG PO CAPS
ORAL_CAPSULE | ORAL | Status: AC
  Administered 2022-05-20: 300 mg via ORAL
  Filled 2022-05-20: qty 1

## 2022-05-20 MED ORDER — OXYCODONE HCL 5 MG PO TABS
5.0000 mg | ORAL_TABLET | Freq: Once | ORAL | Status: AC | PRN
  Administered 2022-05-20: 5 mg via ORAL

## 2022-05-20 MED ORDER — KETOROLAC TROMETHAMINE 30 MG/ML IJ SOLN
INTRAMUSCULAR | Status: AC
  Filled 2022-05-20: qty 1

## 2022-05-20 MED ORDER — OXYCODONE HCL 5 MG/5ML PO SOLN: 5.0000 mg | Freq: Once | ORAL | Status: AC | PRN

## 2022-05-20 MED ORDER — PHENYLEPHRINE 80 MCG/ML (10ML) SYRINGE FOR IV PUSH (FOR BLOOD PRESSURE SUPPORT)
PREFILLED_SYRINGE | INTRAVENOUS | Status: AC
  Filled 2022-05-20: qty 10

## 2022-05-20 MED ORDER — PHENYLEPHRINE HCL (PRESSORS) 10 MG/ML IV SOLN
INTRAVENOUS | Status: DC | PRN
  Administered 2022-05-20 (×3): 120 ug via INTRAVENOUS

## 2022-05-20 MED ORDER — MIDAZOLAM HCL 2 MG/2ML IJ SOLN
INTRAMUSCULAR | Status: DC | PRN
  Administered 2022-05-20: 2 mg via INTRAVENOUS

## 2022-05-20 MED ORDER — 0.9 % SODIUM CHLORIDE (POUR BTL) OPTIME
TOPICAL | Status: DC | PRN
  Administered 2022-05-20: 500 mL

## 2022-05-20 MED ORDER — EPHEDRINE 5 MG/ML INJ
INTRAVENOUS | Status: AC
  Filled 2022-05-20: qty 5

## 2022-05-20 MED ORDER — CHLORHEXIDINE GLUCONATE 0.12 % MT SOLN
OROMUCOSAL | Status: AC
  Administered 2022-05-20: 15 mL via OROMUCOSAL
  Filled 2022-05-20: qty 15

## 2022-05-20 MED ORDER — DEXAMETHASONE SODIUM PHOSPHATE 10 MG/ML IJ SOLN
INTRAMUSCULAR | Status: AC
  Filled 2022-05-20: qty 1

## 2022-05-20 MED ORDER — PROPOFOL 10 MG/ML IV BOLUS
INTRAVENOUS | Status: DC | PRN
  Administered 2022-05-20: 200 mg via INTRAVENOUS

## 2022-05-20 MED ORDER — ACETAMINOPHEN 500 MG PO TABS
ORAL_TABLET | ORAL | Status: AC
  Administered 2022-05-20: 1000 mg via ORAL
  Filled 2022-05-20: qty 2

## 2022-05-20 MED ORDER — LIDOCAINE HCL (PF) 2 % IJ SOLN
INTRAMUSCULAR | Status: AC
  Filled 2022-05-20: qty 5

## 2022-05-20 MED ORDER — LIDOCAINE HCL (CARDIAC) PF 100 MG/5ML IV SOSY
PREFILLED_SYRINGE | INTRAVENOUS | Status: DC | PRN
  Administered 2022-05-20: 100 mg via INTRAVENOUS

## 2022-05-20 MED ORDER — MIDAZOLAM HCL 2 MG/2ML IJ SOLN
INTRAMUSCULAR | Status: AC
  Filled 2022-05-20: qty 2

## 2022-05-20 MED ORDER — ONDANSETRON HCL 4 MG/2ML IJ SOLN
INTRAMUSCULAR | Status: DC | PRN
  Administered 2022-05-20: 4 mg via INTRAVENOUS

## 2022-05-20 MED ORDER — HYDROMORPHONE HCL 1 MG/ML IJ SOLN
INTRAMUSCULAR | Status: AC
  Filled 2022-05-20: qty 1

## 2022-05-20 MED ORDER — SUCCINYLCHOLINE CHLORIDE 200 MG/10ML IV SOSY
PREFILLED_SYRINGE | INTRAVENOUS | Status: DC | PRN
  Administered 2022-05-20: 100 mg via INTRAVENOUS

## 2022-05-20 MED ORDER — FAMOTIDINE 20 MG PO TABS
ORAL_TABLET | ORAL | Status: AC
  Administered 2022-05-20: 20 mg via ORAL
  Filled 2022-05-20: qty 1

## 2022-05-20 MED ORDER — CEFAZOLIN SODIUM-DEXTROSE 2-4 GM/100ML-% IV SOLN
INTRAVENOUS | Status: AC
  Filled 2022-05-20: qty 100

## 2022-05-20 MED ORDER — FENTANYL CITRATE (PF) 100 MCG/2ML IJ SOLN
INTRAMUSCULAR | Status: AC
  Filled 2022-05-20: qty 2

## 2022-05-20 MED ORDER — FENTANYL CITRATE (PF) 100 MCG/2ML IJ SOLN
INTRAMUSCULAR | Status: DC | PRN
  Administered 2022-05-20 (×2): 50 ug via INTRAVENOUS

## 2022-05-20 MED ORDER — OXYCODONE HCL 5 MG PO TABS
ORAL_TABLET | ORAL | Status: AC
  Filled 2022-05-20: qty 1

## 2022-05-20 MED ORDER — GLYCOPYRROLATE 0.2 MG/ML IJ SOLN
INTRAMUSCULAR | Status: DC | PRN
  Administered 2022-05-20: .1 mg via INTRAVENOUS

## 2022-05-20 MED ORDER — DEXAMETHASONE SODIUM PHOSPHATE 10 MG/ML IJ SOLN
INTRAMUSCULAR | Status: DC | PRN
  Administered 2022-05-20: 10 mg via INTRAVENOUS

## 2022-05-20 MED ORDER — EPHEDRINE SULFATE (PRESSORS) 50 MG/ML IJ SOLN
INTRAMUSCULAR | Status: DC | PRN
  Administered 2022-05-20 (×2): 5 mg via INTRAVENOUS

## 2022-05-20 MED ORDER — SUCCINYLCHOLINE CHLORIDE 200 MG/10ML IV SOSY
PREFILLED_SYRINGE | INTRAVENOUS | Status: AC
  Filled 2022-05-20: qty 10

## 2022-05-20 MED ORDER — ONDANSETRON HCL 4 MG/2ML IJ SOLN
INTRAMUSCULAR | Status: AC
  Filled 2022-05-20: qty 2

## 2022-05-20 MED ORDER — PROPOFOL 10 MG/ML IV BOLUS
INTRAVENOUS | Status: AC
  Filled 2022-05-20: qty 20

## 2022-05-20 MED ORDER — FENTANYL CITRATE (PF) 100 MCG/2ML IJ SOLN: 25.0000 ug | INTRAMUSCULAR | Status: DC | PRN

## 2022-05-20 SURGICAL SUPPLY — 29 items
ABLATOR SURESOUND NOVASURE (ABLATOR) IMPLANT
BAG PRESSURE INF REUSE 1000 (BAG) ×1 IMPLANT
BASIN KIT SINGLE STR (MISCELLANEOUS) IMPLANT
DEVICE MYOSURE LITE (MISCELLANEOUS) IMPLANT
DEVICE MYOSURE REACH (MISCELLANEOUS) IMPLANT
DRSG TELFA 3X8 NADH STRL (GAUZE/BANDAGES/DRESSINGS) ×1 IMPLANT
GAUZE 4X4 16PLY ~~LOC~~+RFID DBL (SPONGE) IMPLANT
GLOVE SURG SYN 8.0 (GLOVE) ×1 IMPLANT
GLOVE SURG SYN 8.0 PF PI (GLOVE) ×1 IMPLANT
GOWN STRL REUS W/ TWL LRG LVL3 (GOWN DISPOSABLE) ×1 IMPLANT
GOWN STRL REUS W/ TWL XL LVL3 (GOWN DISPOSABLE) ×1 IMPLANT
GOWN STRL REUS W/TWL LRG LVL3 (GOWN DISPOSABLE) ×1
GOWN STRL REUS W/TWL XL LVL3 (GOWN DISPOSABLE) ×1
IV NS 1000ML (IV SOLUTION)
IV NS 1000ML BAXH (IV SOLUTION) ×1 IMPLANT
IV NS IRRIG 3000ML ARTHROMATIC (IV SOLUTION) ×1 IMPLANT
KIT PROCEDURE FLUENT (KITS) IMPLANT
KIT TURNOVER CYSTO (KITS) ×1 IMPLANT
MANIFOLD NEPTUNE II (INSTRUMENTS) ×1 IMPLANT
PACK DNC HYST (MISCELLANEOUS) IMPLANT
PAD OB MATERNITY 4.3X12.25 (PERSONAL CARE ITEMS) ×1 IMPLANT
PAD PREP 24X41 OB/GYN DISP (PERSONAL CARE ITEMS) ×1 IMPLANT
SCRUB CHG 4% DYNA-HEX 4OZ (MISCELLANEOUS) ×1 IMPLANT
SEAL ROD LENS SCOPE MYOSURE (ABLATOR) ×1 IMPLANT
SET CYSTO W/LG BORE CLAMP LF (SET/KITS/TRAYS/PACK) IMPLANT
TOWEL OR 17X26 4PK STRL BLUE (TOWEL DISPOSABLE) ×1 IMPLANT
TRAP FLUID SMOKE EVACUATOR (MISCELLANEOUS) ×1 IMPLANT
TUBING CONNECTING 10 (TUBING) IMPLANT
WATER STERILE IRR 500ML POUR (IV SOLUTION) ×1 IMPLANT

## 2022-05-20 NOTE — Brief Op Note (Signed)
05/20/2022  10:02 AM  PATIENT:  Savannah Kelly  47 y.o. female  PRE-OPERATIVE DIAGNOSIS:  Submucosal fibroid, menorrhagia  POST-OPERATIVE DIAGNOSIS:  menorrhagia   PROCEDURE:   Endometrial curettage , hysteroscopy , Novasure endometrial ablation  SURGEON:  Surgeon(s) and Role:    * Tonica Brasington, Gwen Her, MD - Primary  PHYSICIAN ASSISTANT: cst  ASSISTANTS: none   ANESTHESIA:   general  EBL:  5 mL  IOF 700  uo 100 cc  BLOOD ADMINISTERED:none  DRAINS: none   LOCAL MEDICATIONS USED:  NONE  SPECIMEN:  Source of Specimen:  endometrial curettings   DISPOSITION OF SPECIMEN:  PATHOLOGY  COUNTS:  YES  TOURNIQUET:  * No tourniquets in log *  DICTATION: .Other Dictation: Dictation Number verbal   PLAN OF CARE: Discharge to home after PACU  PATIENT DISPOSITION:  PACU - hemodynamically stable.   Delay start of Pharmacological VTE agent (>24hrs) due to surgical blood loss or risk of bleeding: not applicable

## 2022-05-20 NOTE — Anesthesia Preprocedure Evaluation (Signed)
Anesthesia Evaluation  Patient identified by MRN, date of birth, ID band Patient awake    Reviewed: Allergy & Precautions, NPO status , Patient's Chart, lab work & pertinent test results  Airway Mallampati: III  TM Distance: >3 FB Neck ROM: full    Dental  (+) Chipped, Dental Advidsory Given   Pulmonary sleep apnea and Continuous Positive Airway Pressure Ventilation , neg COPD   Pulmonary exam normal        Cardiovascular (-) angina (-) Past MI and (-) CABG negative cardio ROS Normal cardiovascular exam     Neuro/Psych  PSYCHIATRIC DISORDERS      negative neurological ROS     GI/Hepatic negative GI ROS, Neg liver ROS,,,  Endo/Other  negative endocrine ROS    Renal/GU      Musculoskeletal   Abdominal   Peds  Hematology negative hematology ROS (+)   Anesthesia Other Findings Past Medical History: No date: Anemia No date: Carpal tunnel syndrome No date: Colon polyps No date: Depression No date: Hemorrhoids No date: Irregular heart beat     Comment:  attributes this to low hgb-pt has been receiving iron               infusions No date: Menorrhagia No date: Obesity No date: Ovarian cyst No date: Pre-diabetes No date: Sleep apnea     Comment:  does not use cpap No date: Submucous uterine fibroid No date: Thyroid goiter  Past Surgical History: 2018: BILATERAL CARPAL TUNNEL RELEASE 10/13/2018: BREAST BIOPSY; Right     Comment:  [place] venus clip fibroadenoma 2006: CESAREAN SECTION No date: COLONOSCOPY 2015: mammogram screening 04/2014: pap smear  BMI    Body Mass Index: 42.62 kg/m      Reproductive/Obstetrics negative OB ROS                             Anesthesia Physical Anesthesia Plan  ASA: 3  Anesthesia Plan: General   Post-op Pain Management:    Induction: Intravenous  PONV Risk Score and Plan: Ondansetron, Dexamethasone and Midazolam  Airway Management Planned:  LMA  Additional Equipment:   Intra-op Plan:   Post-operative Plan: Extubation in OR  Informed Consent: I have reviewed the patients History and Physical, chart, labs and discussed the procedure including the risks, benefits and alternatives for the proposed anesthesia with the patient or authorized representative who has indicated his/her understanding and acceptance.     Dental Advisory Given  Plan Discussed with: Anesthesiologist, CRNA and Surgeon  Anesthesia Plan Comments: (Patient consented for risks of anesthesia including but not limited to:  - adverse reactions to medications - damage to eyes, teeth, lips or other oral mucosa - nerve damage due to positioning  - sore throat or hoarseness - Damage to heart, brain, nerves, lungs, other parts of body or loss of life  Patient voiced understanding.)        Anesthesia Quick Evaluation

## 2022-05-20 NOTE — Transfer of Care (Signed)
Immediate Anesthesia Transfer of Care Note  Patient: Savannah Kelly  Procedure(s) Performed: DILATATION & CURETTAGE/HYSTEROSCOPY WITH NOVASURE ABLATION,WITH MYOSURE RESECTION OF ENDOMETIAL FIBROID (Uterus)  Patient Location: PACU  Anesthesia Type:General  Level of Consciousness: drowsy  Airway & Oxygen Therapy: Patient Spontanous Breathing and Patient connected to face mask oxygen  Post-op Assessment: Report given to RN and Post -op Vital signs reviewed and stable  Post vital signs: Reviewed and stable  Last Vitals:  Vitals Value Taken Time  BP 115/65 05/20/22 1030  Temp 36.2 C 05/20/22 1022  Pulse 97 05/20/22 1031  Resp 24 05/20/22 1031  SpO2 100 % 05/20/22 1031  Vitals shown include unvalidated device data.  Last Pain:  Vitals:   05/20/22 1022  TempSrc:   PainSc: Asleep         Complications: No notable events documented.

## 2022-05-20 NOTE — Anesthesia Procedure Notes (Addendum)
Procedure Name: Intubation Date/Time: 05/20/2022 9:19 AM  Performed by: Regis Wiland, [place], CRNAPre-anesthesia Checklist: Patient identified, Patient being monitored, Timeout performed, Emergency Drugs available and Suction available Patient Re-evaluated:Patient Re-evaluated prior to induction Oxygen Delivery Method: Circle system utilized Preoxygenation: Pre-oxygenation with 100% oxygen Induction Type: IV induction Ventilation: Mask ventilation without difficulty Laryngoscope Size: 3 and McGraph Grade View: Grade I Tube type: Oral Tube size: 7.0 mm Number of attempts: 1 Airway Equipment and Method: Stylet Placement Confirmation: ETT inserted through vocal cords under direct vision, positive ETCO2 and breath sounds checked- equal and bilateral Secured at: 22 cm Tube secured with: Tape Dental Injury: Teeth and Oropharynx as per pre-operative assessment

## 2022-05-20 NOTE — Op Note (Signed)
NAMEJANIT, LATTIN MEDICAL RECORD NO: [id number] ACCOUNT NO: [id number] DATE OF BIRTH: Sep 18, 1975 FACILITY: [facility] LOCATION: [facility] PHYSICIAN: Boykin Nearing, MD  Operative Report   DATE OF PROCEDURE: 05/20/2022  PREOPERATIVE DIAGNOSES:   1.  Menorrhagia. 2.  Iron deficiency anemia. 3.  Submucosal fibroid.  POSTOPERATIVE DIAGNOSES:   1.  Menorrhagia. 2.  Iron deficiency anemia.  PROCEDURE:   1.  Endometrial curettage. 2.  Hysteroscopy. 3.  NovaSure endometrial ablation.  SURGEON:  Boykin Nearing, MD  ANESTHESIA:  General endotracheal anesthesia.  INDICATIONS:  A 47 year old gravida 3, para 1, patient with a long history of menorrhagia and iron deficiency anemia.  The patient's on saline infusion sonohysterography revealed a submucosal fibroid.  DESCRIPTION OF PROCEDURE:  After adequate general endotracheal anesthesia, the patient was placed in dorsal supine position with the legs in the candy cane stirrups.  The patient's perineum and vagina were prepped and draped in normal sterile fashion.   Timeout was performed.  The patient did receive 2 grams of IV Ancef prior to commencement of the case.  Straight catheterization of the bladder yielded 100 mL clear urine.  Weighted speculum was placed in the posterior vaginal vault and the anterior  cervix was grasped with single tooth tenaculum.  Cervix was dilated to #17 Hanks dilator.  Uterine sound, sounds to 10 cm. The 5 mm hysteroscope was advanced into the endometrial cavity with the fluent infiltrating saline system utilized.  There was no  submucosal fibroid that could be identified.  A clear picture of the cavity and pictures were taken.  Hysteroscope was removed and an endometrial curettage was performed.  The NovaSure endometrial ablator was then brought up to the operative field and  the ablator was placed into the endometrial cavity.  Cavity length was estimated a 5 cm, based on sounding a 10 cm and  cervical length of 5 cm.  Power setting of 110 based on the cavity width of 4 cm.  The ablation cavity assessment, safety procedure  performed and passed, and the ablation then took place for 1 minute 46 seconds.  The ablator was then removed and hysteroscope was readvanced into the endometrial cavity showing the normal charring effect.  Pictures were taken.  Hysteroscope was removed.   Single tooth tenaculum was removed.  Silver nitrate was applied on the tenacula sites and procedure was terminated.  Good hemostasis noted.  ESTIMATED BLOOD LOSS:  5 mL  INTRAOPERATIVE FLUIDS:  700 mL  URINE OUTPUT:  100 mL  The patient did receive 30 mg of intravenous Toradol at the end of the procedure.  She tolerated the procedure well and was taken to recovery room in good condition.   VAI D: 05/20/2022 10:33:17 am T: 05/20/2022 10:52:00 am  JOB: [id number]

## 2022-05-20 NOTE — Anesthesia Postprocedure Evaluation (Signed)
Anesthesia Post Note  Patient: Savannah Kelly  Procedure(s) Performed: DILATATION & CURETTAGE/HYSTEROSCOPY WITH NOVASURE ABLATION,WITH MYOSURE RESECTION OF ENDOMETIAL FIBROID (Uterus)  Patient location during evaluation: PACU Anesthesia Type: General Level of consciousness: awake and alert Pain management: pain level controlled Vital Signs Assessment: post-procedure vital signs reviewed and stable Respiratory status: spontaneous breathing, nonlabored ventilation, respiratory function stable and patient connected to nasal cannula oxygen Cardiovascular status: blood pressure returned to baseline and stable Postop Assessment: no apparent nausea or vomiting Anesthetic complications: no  No notable events documented.   Last Vitals:  Vitals:   05/20/22 1100 05/20/22 1127  BP: 122/71 123/70  Pulse: 87 74  Resp: 18 17  Temp:  (!) 36.1 C  SpO2: 100% 100%    Last Pain:  Vitals:   05/20/22 1127  TempSrc: Temporal  PainSc: [place]

## 2022-05-20 NOTE — Progress Notes (Signed)
Pt here for endometrial resection of submucosal fibroid and Novasure ablation . All labs reviewed . Neg HCG . All question answered . Proceed

## 2022-05-20 NOTE — Discharge Instructions (Signed)
AMBULATORY SURGERY  ?DISCHARGE INSTRUCTIONS ? ? ?The drugs that you were given will stay in your system until tomorrow so for the next 24 hours you should not: ? ?Drive an automobile ?Make any legal decisions ?Drink any alcoholic beverage ? ? ?You may resume regular meals tomorrow.  Today it is better to start with liquids and gradually work up to solid foods. ? ?You may eat anything you prefer, but it is better to start with liquids, then soup and crackers, and gradually work up to solid foods. ? ? ?Please notify your doctor immediately if you have any unusual bleeding, trouble breathing, redness and pain at the surgery site, drainage, fever, or pain not relieved by medication. ? ? ? ?Additional Instructions: ? ? ? ?Please contact your physician with any problems or Same Day Surgery at [phone], Monday through Friday 6 am to 4 pm, or [facility] [place] Main number at [phone].  ?

## 2022-05-21 ENCOUNTER — Encounter: Payer: Self-pay | Admitting: Obstetrics and Gynecology

## 2022-05-21 LAB — SURGICAL PATHOLOGY

## 2022-06-02 ENCOUNTER — Other Ambulatory Visit: Payer: 59

## 2022-06-22 DIAGNOSIS — G4733 Obstructive sleep apnea (adult) (pediatric): Secondary | ICD-10-CM | POA: Diagnosis not present

## 2022-07-07 DIAGNOSIS — D509 Iron deficiency anemia, unspecified: Secondary | ICD-10-CM | POA: Diagnosis not present

## 2022-07-07 DIAGNOSIS — Z1211 Encounter for screening for malignant neoplasm of colon: Secondary | ICD-10-CM | POA: Diagnosis not present

## 2022-07-22 DIAGNOSIS — G4733 Obstructive sleep apnea (adult) (pediatric): Secondary | ICD-10-CM | POA: Diagnosis not present

## 2022-09-15 DIAGNOSIS — G4733 Obstructive sleep apnea (adult) (pediatric): Secondary | ICD-10-CM | POA: Diagnosis not present

## 2022-10-16 DIAGNOSIS — G4733 Obstructive sleep apnea (adult) (pediatric): Secondary | ICD-10-CM | POA: Diagnosis not present

## 2022-11-10 DIAGNOSIS — H52223 Regular astigmatism, bilateral: Secondary | ICD-10-CM | POA: Diagnosis not present

## 2022-11-10 DIAGNOSIS — E119 Type 2 diabetes mellitus without complications: Secondary | ICD-10-CM | POA: Diagnosis not present

## 2022-11-10 DIAGNOSIS — H18603 Keratoconus, unspecified, bilateral: Secondary | ICD-10-CM | POA: Diagnosis not present

## 2023-01-05 DIAGNOSIS — G4733 Obstructive sleep apnea (adult) (pediatric): Secondary | ICD-10-CM | POA: Diagnosis not present

## 2023-02-02 ENCOUNTER — Encounter: Payer: Self-pay | Admitting: Gastroenterology

## 2023-02-05 DIAGNOSIS — G4733 Obstructive sleep apnea (adult) (pediatric): Secondary | ICD-10-CM | POA: Diagnosis not present

## 2023-02-24 NOTE — OR Nursing (Signed)
Pre call made, patient states that she will not be able to make this appointment, number to office given.

## 2023-03-07 DIAGNOSIS — G4733 Obstructive sleep apnea (adult) (pediatric): Secondary | ICD-10-CM | POA: Diagnosis not present

## 2023-03-08 ENCOUNTER — Encounter: Payer: Self-pay | Admitting: Gastroenterology

## 2023-03-10 ENCOUNTER — Ambulatory Visit: Admission: RE | Admit: 2023-03-10 | Payer: 59 | Source: Home / Self Care | Admitting: Gastroenterology

## 2023-03-10 ENCOUNTER — Encounter: Admission: RE | Payer: Self-pay | Source: Home / Self Care

## 2023-03-10 SURGERY — COLONOSCOPY WITH PROPOFOL
Anesthesia: General

## 2023-04-18 ENCOUNTER — Other Ambulatory Visit: Payer: Self-pay | Admitting: Obstetrics and Gynecology

## 2023-04-18 DIAGNOSIS — M79671 Pain in right foot: Secondary | ICD-10-CM | POA: Diagnosis not present

## 2023-04-18 DIAGNOSIS — Z1231 Encounter for screening mammogram for malignant neoplasm of breast: Secondary | ICD-10-CM

## 2023-05-03 ENCOUNTER — Ambulatory Visit
Admission: RE | Admit: 2023-05-03 | Discharge: 2023-05-03 | Disposition: A | Discharge status: Home / Self Care | Payer: Commercial Managed Care - PPO | Source: Ambulatory Visit | Attending: Obstetrics and Gynecology | Admitting: Obstetrics and Gynecology

## 2023-05-03 DIAGNOSIS — Z1231 Encounter for screening mammogram for malignant neoplasm of breast: Secondary | ICD-10-CM | POA: Insufficient documentation

## 2023-08-10 DIAGNOSIS — G4733 Obstructive sleep apnea (adult) (pediatric): Secondary | ICD-10-CM | POA: Diagnosis not present

## 2023-09-26 IMAGING — MG MM DIGITAL SCREENING BILAT W/ TOMO AND CAD
8 series · 8 of 24 positions shown · non-contrast
Comparison: Previous exam(s).

CLINICAL DATA: Screening.

EXAM:
DIGITAL SCREENING BILATERAL MAMMOGRAM WITH TOMOSYNTHESIS AND CAD
TECHNIQUE: Bilateral screening digital craniocaudal and mediolateral oblique
mammograms were obtained. Bilateral screening digital breast
tomosynthesis was performed. The images were evaluated with
computer-aided detection.

[L MLO synth-2D]
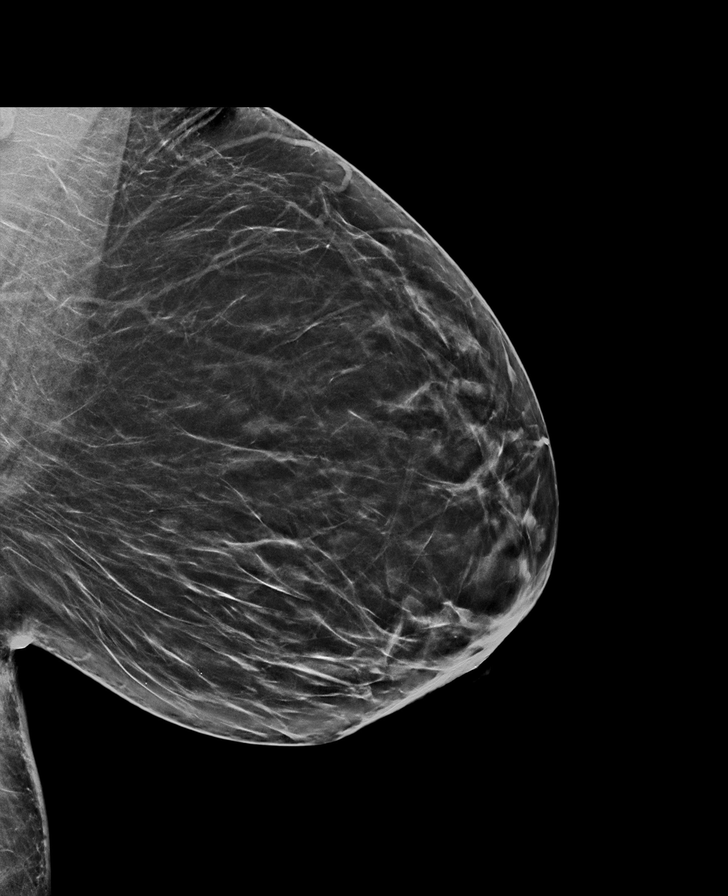

[R CC synth-2D]
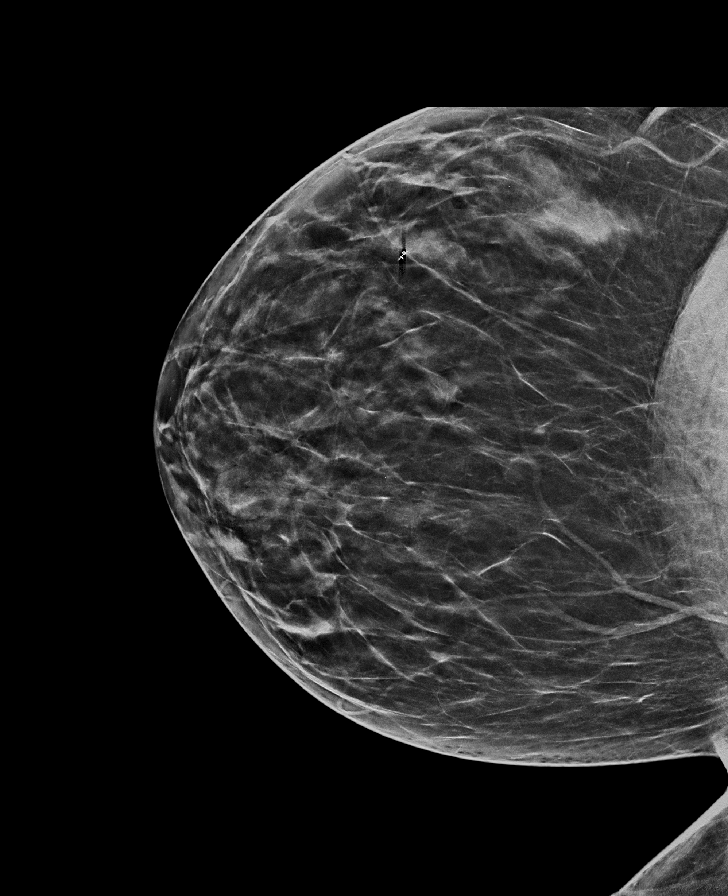

[R MLO synth-2D]
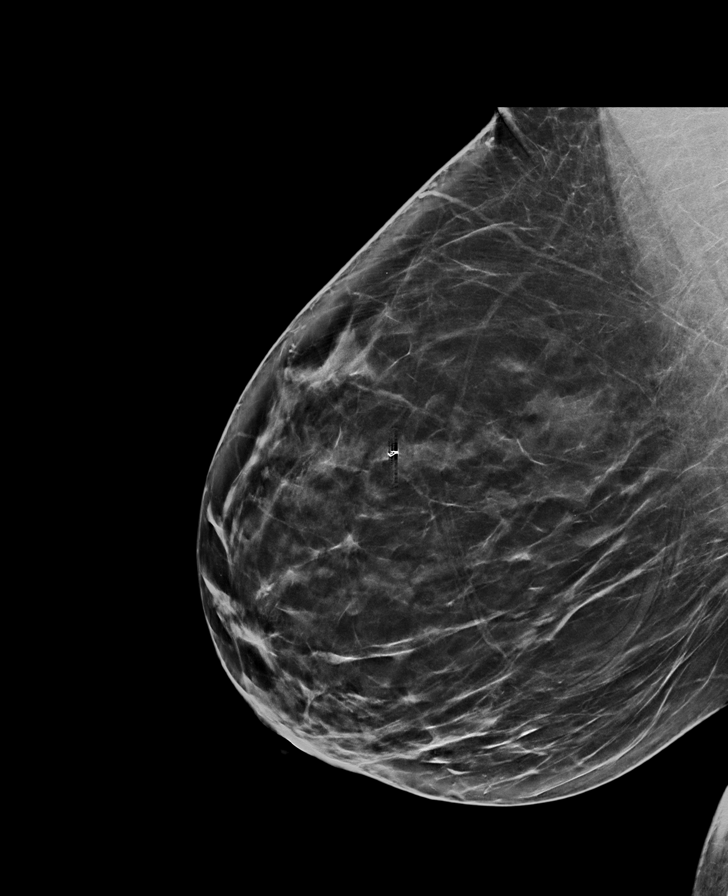

[L CC synth-2D]
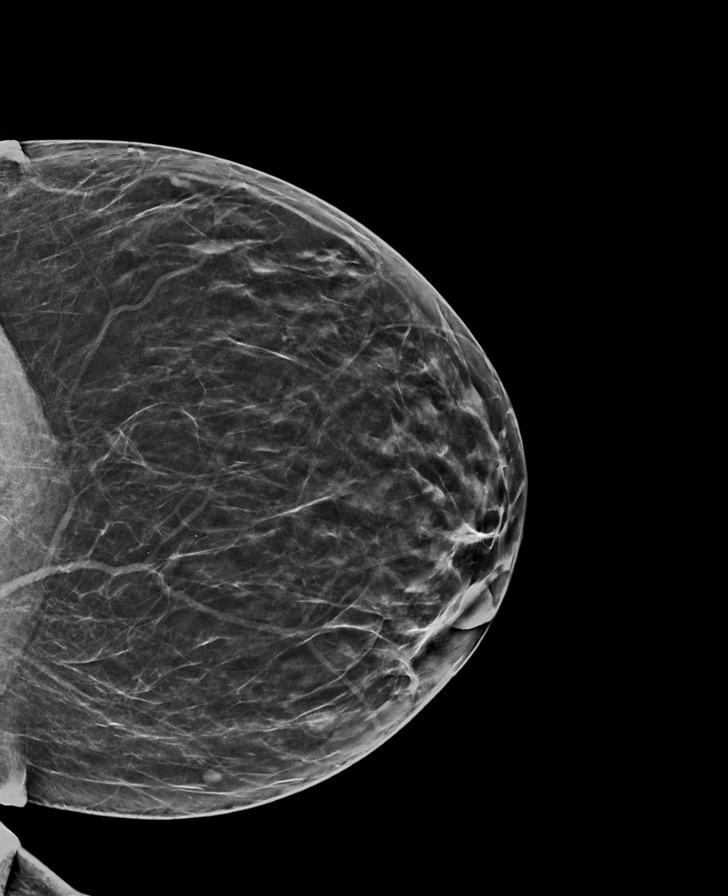

[R MLO tomo · tomo slice 37/72.0]
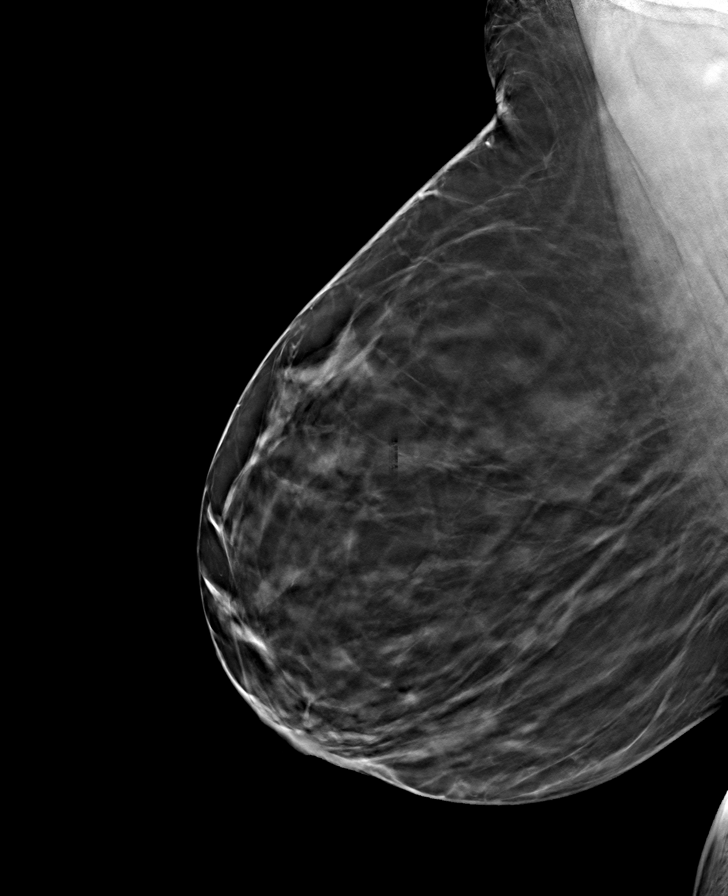

[R CC tomo · tomo slice 33/64.0]
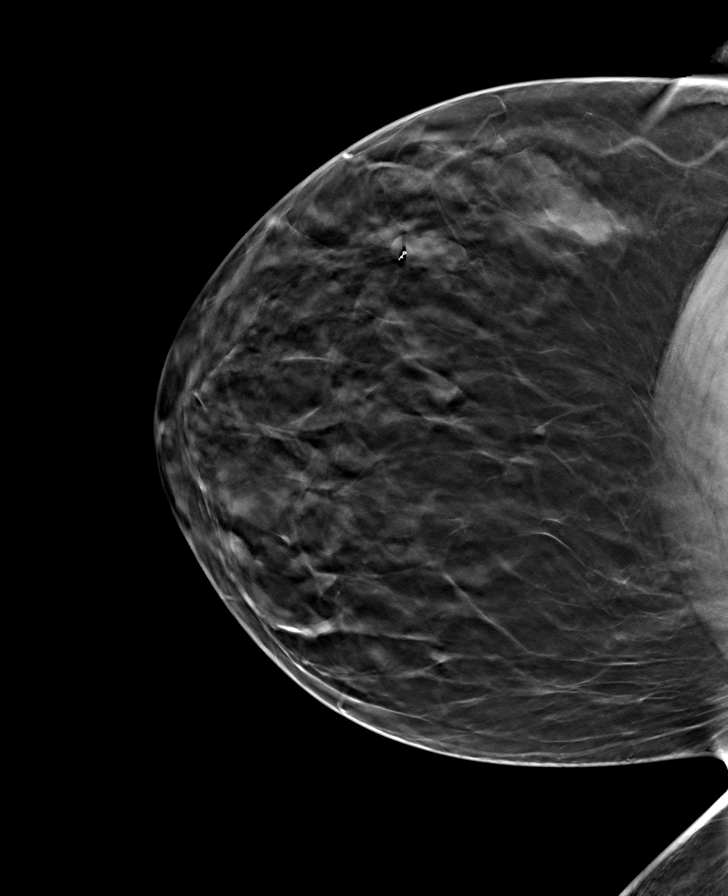

[L MLO tomo · tomo slice 34/67.0]
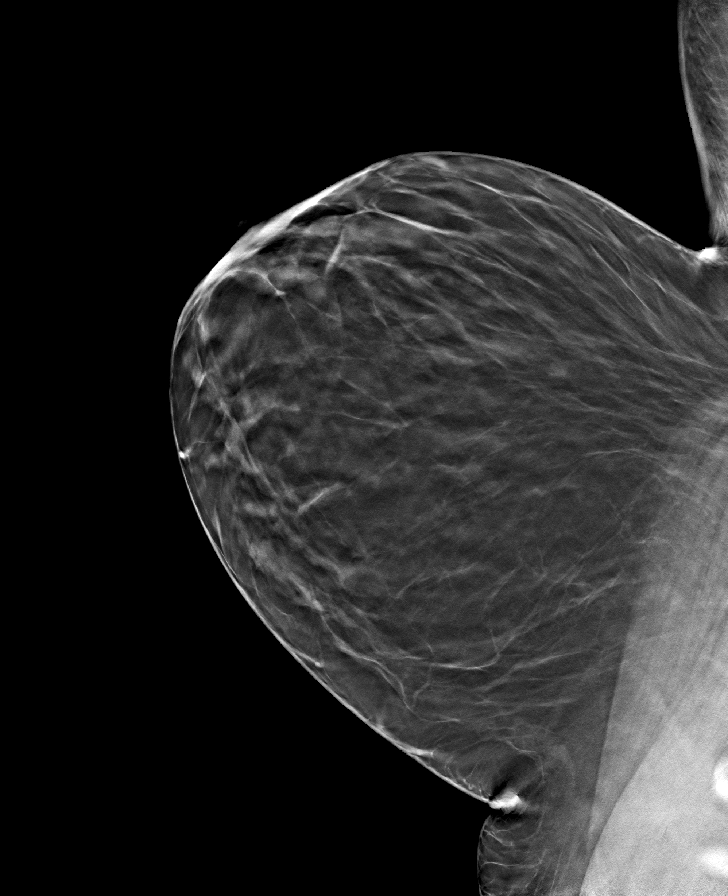

[L CC tomo · tomo slice 32/63.0]
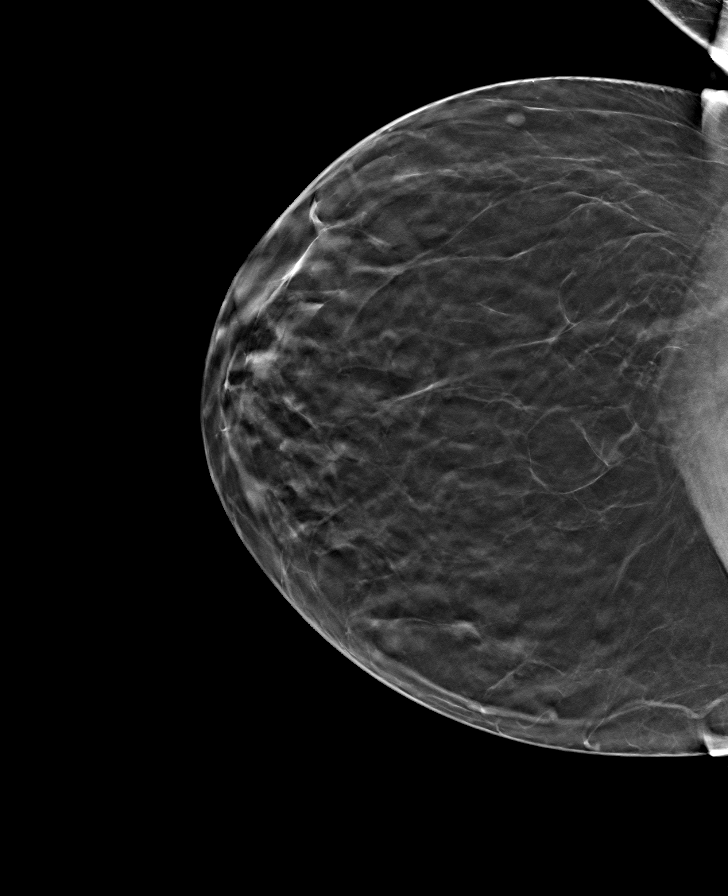

[8 of 24 positions shown; findings below may reference images not displayed]

ACR Breast Density Category b: There are scattered areas of
fibroglandular density.
FINDINGS: There are no findings suspicious for malignancy.
IMPRESSION: No mammographic evidence of malignancy. A result letter of this
screening mammogram will be mailed directly to the patient.

RECOMMENDATION:
Screening mammogram in one year. (Code:51-O-LD2)

BI-RADS CATEGORY  1: Negative.
